# Patient Record
Sex: Male | Born: 1965 | Race: White | Hispanic: No | Marital: Single | Smoking: Never smoker
Health system: Southern US, Community
[De-identification: ages and names within clinical notes are randomized; demographics above are authoritative.]

## PROBLEM LIST (undated history)

## (undated) DIAGNOSIS — G5600 Carpal tunnel syndrome, unspecified upper limb: Secondary | ICD-10-CM

## (undated) DIAGNOSIS — F419 Anxiety disorder, unspecified: Secondary | ICD-10-CM

## (undated) HISTORY — PX: HERNIA REPAIR: SHX51

---

## 2015-07-25 ENCOUNTER — Emergency Department (HOSPITAL_COMMUNITY): Payer: Federal, State, Local not specified - PPO

## 2015-07-25 ENCOUNTER — Emergency Department (HOSPITAL_BASED_OUTPATIENT_CLINIC_OR_DEPARTMENT_OTHER)
Admit: 2015-07-25 | Discharge: 2015-07-25 | Disposition: A | Discharge status: Home / Self Care | Payer: Federal, State, Local not specified - PPO | Attending: Emergency Medicine | Admitting: Emergency Medicine

## 2015-07-25 ENCOUNTER — Encounter (HOSPITAL_COMMUNITY): Payer: Self-pay | Admitting: Emergency Medicine

## 2015-07-25 ENCOUNTER — Emergency Department (HOSPITAL_COMMUNITY)
Admission: EM | Admit: 2015-07-25 | Discharge: 2015-07-25 | Disposition: A | Discharge status: Home / Self Care | Payer: Federal, State, Local not specified - PPO | Attending: Emergency Medicine | Admitting: Emergency Medicine

## 2015-07-25 DIAGNOSIS — M79609 Pain in unspecified limb: Secondary | ICD-10-CM

## 2015-07-25 DIAGNOSIS — I10 Essential (primary) hypertension: Secondary | ICD-10-CM | POA: Insufficient documentation

## 2015-07-25 DIAGNOSIS — M7989 Other specified soft tissue disorders: Secondary | ICD-10-CM

## 2015-07-25 DIAGNOSIS — T148XXA Other injury of unspecified body region, initial encounter: Secondary | ICD-10-CM

## 2015-07-25 HISTORY — DX: Carpal tunnel syndrome, unspecified upper limb: G56.00

## 2015-07-25 LAB — CBC WITH DIFFERENTIAL/PLATELET
Basophils Absolute: 0.1 10*3/uL (ref 0.0–0.1)
Basophils Relative: 1 %
EOS PCT: 3 %
Eosinophils Absolute: 0.2 10*3/uL (ref 0.0–0.7)
HCT: 46.7 % (ref 39.0–52.0)
Hemoglobin: 15.3 g/dL (ref 13.0–17.0)
Lymphocytes Relative: 21 %
Lymphs Abs: 1.8 10*3/uL (ref 0.7–4.0)
MCH: 28.7 pg (ref 26.0–34.0)
MCHC: 32.8 g/dL (ref 30.0–36.0)
MCV: 87.6 fL (ref 78.0–100.0)
MONO ABS: 0.7 10*3/uL (ref 0.1–1.0)
MONOS PCT: 8 %
NEUTROS PCT: 67 %
Neutro Abs: 5.8 10*3/uL (ref 1.7–7.7)
PLATELETS: 165 10*3/uL (ref 150–400)
RBC: 5.33 MIL/uL (ref 4.22–5.81)
RDW: 14.6 % (ref 11.5–15.5)
WBC: 8.5 10*3/uL (ref 4.0–10.5)

## 2015-07-25 NOTE — Progress Notes (Signed)
VASCULAR LAB PRELIMINARY  PRELIMINARY  PRELIMINARY  PRELIMINARY  Left upper extremity venous duplex has been completed.    LEFT upper extremity:  No evidence of DVT or superficial thrombosis.    Gave results to KonawaJada ,RN  Jenetta Logesami Vernisha Bacote, RVT, RDMS 07/25/2015, 2:44 PM

## 2015-07-25 NOTE — ED Notes (Signed)
Patient transported to X-ray 

## 2015-07-25 NOTE — ED Provider Notes (Signed)
CSN: [id number]     Arrival date & time 07/25/15  1105 History   First MD Initiated Contact with Patient 07/25/15 1118     Chief Complaint  Patient presents with  . Arm Swelling  . Hypertension      HPI Patient reports swelling and bruising to his left upper extremity.  This been present for 6 days.  He awoke last Thursday with pain in his left elbow and a small amount of swelling.  He reports that the bruising and swelling of his left upper extremity is worsened.  He does have some pain with palpation of his left medial elbow.  He denies weakness of his left upper extremity.  He does not remember any injury or trauma to his left upper extremity.  No history DVT.  He was seen in urgent care today and sent to the emergency department for evaluation.  No other easy bruising or bleeding noted by the patient.  Patient currently is without a primary care physician and relocated from [place] 8-10 years ago.   Past Medical History  Diagnosis Date  . Carpal tunnel syndrome    History reviewed. No pertinent past surgical history. No family history on file. Social History  Substance Use Topics  . Smoking status: Never Smoker   . Smokeless tobacco: None  . Alcohol Use: No    Review of Systems  All other systems reviewed and are negative.     Allergies  Bee venom  Home Medications   Prior to Admission medications   Medication Sig Start Date End Date Taking? Authorizing Provider  Ascorbic Acid (VITAMIN C PO) Take 1 tablet by mouth daily.   Yes Historical Provider, MD  CALCIUM PO Take 1 tablet by mouth daily.   Yes Historical Provider, MD  Cholecalciferol (VITAMIN D3 PO) Take 1 capsule by mouth daily.   Yes Historical Provider, MD  Cyanocobalamin (B-12 PO) Take 1 tablet by mouth daily.   Yes Historical Provider, MD  diphenhydrAMINE (BENADRYL) 25 MG tablet Take 25 mg by mouth every 6 (six) hours as needed for itching or allergies.   Yes Historical Provider, MD  Ibuprofen-Diphenhydramine  Cit (ADVIL PM PO) Take 1 tablet by mouth daily as needed (sleep).   Yes Historical Provider, MD  MAGNESIUM PO Take 1 tablet by mouth daily.   Yes Historical Provider, MD  Multiple Vitamin (MULTIVITAMIN WITH MINERALS) TABS tablet Take 1 tablet by mouth daily.   Yes Historical Provider, MD   BP 151/99 mmHg  Pulse 84  Temp(Src) 98.4 F (36.9 C) (Oral)  Resp 18  Ht 6' (1.829 m)  Wt 321 lb 14 oz (146 kg)  BMI 43.64 kg/m2  SpO2 92% Physical Exam  Constitutional: He is oriented to person, place, and time. He appears well-developed and well-nourished.  Morbidly obese  HENT:  Head: Normocephalic and atraumatic.  Eyes: EOM are normal.  Neck: Normal range of motion.  Cardiovascular: Normal rate, regular rhythm and normal heart sounds.   Pulmonary/Chest: Effort normal and breath sounds normal. No respiratory distress.  Abdominal: Soft. He exhibits no distension. There is no tenderness.  Musculoskeletal: Normal range of motion.  Mild swelling of left upper extremity as compared to right.  Normal left radial pulse.  Diffuse bruising throughout his left medial upper and lower arm with some tenderness at the left medial upper condyle.  No obvious deformity.  Full range of motion of left elbow, left shoulder, left wrist.  Neurological: He is alert and oriented to person, place, and  time.  Skin: Skin is warm and dry.  Psychiatric: He has a normal mood and affect. Judgment normal.  Nursing note and vitals reviewed.   ED Course  Procedures (including critical care time) Labs Review Labs Reviewed  CBC WITH DIFFERENTIAL/PLATELET    Imaging Review Dg Elbow Complete Left  07/25/2015  CLINICAL DATA:  Bruising and pain left elbow for 6 days. No known trauma EXAM: LEFT ELBOW - COMPLETE 3+ VIEW COMPARISON:  None. FINDINGS: Frontal, lateral, and bilateral oblique views were obtained. There is generalized soft tissue swelling. There is no apparent fracture or dislocation. No joint effusion. The joint spaces  appear normal. No erosive change. There is an apparent bone island in the proximal radial metaphysis. IMPRESSION: Soft tissue swelling. No fracture or dislocation. No appreciable arthropathy. Bone island proximal radius. Electronically Signed   By: Bretta Bang III M.D.   On: 07/25/2015 13:21   I have personally reviewed and evaluated these images and lab results as part of my medical decision-making.   EKG Interpretation None      MDM   Final diagnoses:  Left upper extremity swelling  Bruising    These duplex of left upper extremity demonstrates no DVT.  Plain film of left elbow demonstrates no elbow fracture.  Likely associated bruising and contusion.  Platelets are normal.  Hemoglobin is normal.  Vital signs are normal.  Whenever he does drift off to sleep.  Emergency department he does drop his oxygen saturations to 70% and they quickly rise when he is awoken.  I suspect the patient has significant sleep apnea.  The patient does report insomnia.  He does not have a primary care physician but I recommended weight loss and exercise as well as a primary care physician as he will need a sleep study.  The patient was informed of this.  He will also need his blood pressure evaluated in a primary care setting.    Azalia Bilis, MD 07/25/15 [phone]

## 2015-07-25 NOTE — ED Notes (Signed)
MD at bedside. 

## 2015-07-25 NOTE — Discharge Instructions (Signed)
Sleep Apnea  Sleep apnea is a sleep disorder characterized by abnormal pauses in breathing while you sleep. When your breathing pauses, the level of oxygen in your blood decreases. This causes you to move out of deep sleep and into light sleep. As a result, your quality of sleep is poor, and the system that carries your blood throughout your body (cardiovascular system) experiences stress. If sleep apnea remains untreated, the following conditions can develop:  High blood pressure (hypertension).  Coronary artery disease.  Inability to achieve or maintain an erection (impotence).  Impairment of your thought process (cognitive dysfunction). There are three types of sleep apnea: 1. Obstructive sleep apnea--Pauses in breathing during sleep because of a blocked airway. 2. Central sleep apnea--Pauses in breathing during sleep because the area of the brain that controls your breathing does not send the correct signals to the muscles that control breathing. 3. Mixed sleep apnea--A combination of both obstructive and central sleep apnea. RISK FACTORS The following risk factors can increase your risk of developing sleep apnea:  Being overweight.  Smoking.  Having narrow passages in your nose and throat.  Being of older age.  Being male.  Alcohol use.  Sedative and tranquilizer use.  Ethnicity. Among individuals younger than 35 years, African Americans are at increased risk of sleep apnea. SYMPTOMS   Difficulty staying asleep.  Daytime sleepiness and fatigue.  Loss of energy.  Irritability.  Loud, heavy snoring.  Morning headaches.  Trouble concentrating.  Forgetfulness.  Decreased interest in sex.  Unexplained sleepiness. DIAGNOSIS  In order to diagnose sleep apnea, your caregiver will perform a physical examination. A sleep study done in the comfort of your own home may be appropriate if you are otherwise healthy. Your caregiver may also recommend that you spend the  night in a sleep lab. In the sleep lab, several monitors record information about your heart, lungs, and brain while you sleep. Your leg and arm movements and blood oxygen level are also recorded. TREATMENT The following actions may help to resolve mild sleep apnea:  Sleeping on your side.   Using a decongestant if you have nasal congestion.   Avoiding the use of depressants, including alcohol, sedatives, and narcotics.   Losing weight and modifying your diet if you are overweight. There also are devices and treatments to help open your airway:  Oral appliances. These are custom-made mouthpieces that shift your lower jaw forward and slightly open your bite. This opens your airway.  Devices that create positive airway pressure. This positive pressure "splints" your airway open to help you breathe better during sleep. The following devices create positive airway pressure:  Continuous positive airway pressure (CPAP) device. The CPAP device creates a continuous level of air pressure with an air pump. The air is delivered to your airway through a mask while you sleep. This continuous pressure keeps your airway open.  Nasal expiratory positive airway pressure (EPAP) device. The EPAP device creates positive air pressure as you exhale. The device consists of single-use valves, which are inserted into each nostril and held in place by adhesive. The valves create very little resistance when you inhale but create much more resistance when you exhale. That increased resistance creates the positive airway pressure. This positive pressure while you exhale keeps your airway open, making it easier to breath when you inhale again.  Bilevel positive airway pressure (BPAP) device. The BPAP device is used mainly in patients with central sleep apnea. This device is similar to the CPAP device because   it also uses an air pump to deliver continuous air pressure through a mask. However, with the BPAP machine, the  pressure is set at two different levels. The pressure when you exhale is lower than the pressure when you inhale.  Surgery. Typically, surgery is only done if you cannot comply with less invasive treatments or if the less invasive treatments do not improve your condition. Surgery involves removing excess tissue in your airway to create a wider passage way.   This information is not intended to replace advice given to you by your health care provider. Make sure you discuss any questions you have with your health care provider.   Document Released: 01/24/2002 Document Revised: 02/24/2014 Document Reviewed: 06/12/2011 Elsevier Interactive Patient Education 2016 Elsevier Inc.   

## 2015-07-25 NOTE — ED Notes (Signed)
Vascular ultra sound at bedside.

## 2015-07-25 NOTE — ED Notes (Addendum)
Per EMS, [facility] swelling/bruising x6 days; worsening as the days progress. Denies injury/trauma/insect bite. Left radial pulse present. Urgent Care also wanted patient evaluated for elevated blood pressure at 168/118. No hx of HTN.

## 2015-07-25 NOTE — ED Notes (Signed)
MD aware of patients vital signs.  

## 2015-07-25 NOTE — ED Notes (Signed)
Discharge instructions and follow up care reviewed with patient. Patient verbalized understanding. 

## 2017-12-09 ENCOUNTER — Ambulatory Visit (INDEPENDENT_AMBULATORY_CARE_PROVIDER_SITE_OTHER): Payer: Self-pay

## 2017-12-09 ENCOUNTER — Ambulatory Visit (INDEPENDENT_AMBULATORY_CARE_PROVIDER_SITE_OTHER): Payer: Federal, State, Local not specified - PPO | Admitting: Orthopaedic Surgery

## 2017-12-09 ENCOUNTER — Encounter (INDEPENDENT_AMBULATORY_CARE_PROVIDER_SITE_OTHER): Payer: Self-pay | Admitting: Orthopaedic Surgery

## 2017-12-09 DIAGNOSIS — G8929 Other chronic pain: Secondary | ICD-10-CM | POA: Diagnosis not present

## 2017-12-09 DIAGNOSIS — M25512 Pain in left shoulder: Secondary | ICD-10-CM

## 2017-12-09 MED ORDER — LIDOCAINE HCL 1 % IJ SOLN
3.0000 mL | INTRAMUSCULAR | Status: AC | PRN
  Administered 2017-12-09: 3 mL

## 2017-12-09 MED ORDER — BUPIVACAINE HCL 0.5 % IJ SOLN
3.0000 mL | INTRAMUSCULAR | Status: AC | PRN
  Administered 2017-12-09: 3 mL via INTRA_ARTICULAR

## 2017-12-09 MED ORDER — METHYLPREDNISOLONE ACETATE 40 MG/ML IJ SUSP
40.0000 mg | INTRAMUSCULAR | Status: AC | PRN
  Administered 2017-12-09: 40 mg via INTRA_ARTICULAR

## 2017-12-09 NOTE — Progress Notes (Signed)
Office Visit Note   Patient: Allen Perry           Date of Birth: [birth year]           MRN: [id number] Visit Date: 12/09/2017              Requested by: No referring provider defined for this encounter. PCP: Patient, No Pcp Per   Assessment & Plan: Visit Diagnoses:  1. Chronic left shoulder pain     Plan: Impression is left rotator cuff syndrome versus subacromial bursitis.  We performed a subacromial injection today and made a referral to physical therapy.  I would like to recheck him in 4 weeks.  If he is not better then I would recommend an MRI at that time.  Work note was provided for no more than 20 pounds lifting.  Follow-Up Instructions: Return in about 4 weeks (around 01/06/2018).   Orders:  Orders Placed This Encounter  Procedures  . XR Shoulder Left   No orders of the defined types were placed in this encounter.     Procedures: Large Joint Inj: L subacromial bursa on 12/09/2017 4:53 PM Indications: pain Details: 22 G needle  Arthrogram: No  Medications: 3 mL lidocaine 1 %; 3 mL bupivacaine 0.5 %; 40 mg methylPREDNISolone acetate 40 MG/ML Outcome: tolerated well, no immediate complications Patient was prepped and draped in the usual sterile fashion.       Clinical Data: No additional findings.   Subjective: Chief Complaint  Patient presents with  . Left Arm Pain    Jerrel is a 52 year old right-hand-dominant gentleman who comes in with left shoulder pain for about 4 to 6 weeks.  He states the pain is worse when lifting his arm and he states the pain is a burning aching pain without radiation.  The pain is mainly localized to the lateral deltoid region.  He denies any radicular symptoms.  Denies any numbness and tingling.   Review of Systems  Constitutional: Negative.   All other systems reviewed and are negative.    Objective: Vital Signs: There were no vitals taken for this visit.  Physical Exam  Constitutional: He is oriented to person,  place, and time. He appears well-developed and well-nourished.  HENT:  Head: Normocephalic and atraumatic.  Eyes: Pupils are equal, round, and reactive to light.  Neck: Neck supple.  Pulmonary/Chest: Effort normal.  Abdominal: Soft.  Musculoskeletal: Normal range of motion.  Neurological: He is alert and oriented to person, place, and time.  Skin: Skin is warm.  Psychiatric: He has a normal mood and affect. His behavior is normal. Judgment and thought content normal.  Nursing note and vitals reviewed.   Ortho Exam Left shoulder exam shows positive empty can and positive impingement.  Negative cross adduction.  Normal range of motion with pain at the extremes. Specialty Comments:  No specialty comments available.  Imaging: Xr Shoulder Left  Result Date: 12/09/2017 No acute or structural abnormalities    PMFS History: There are no active problems to display for this patient.  Past Medical History:  Diagnosis Date  . Carpal tunnel syndrome     No family history on file.  No past surgical history on file. Social History   Occupational History  . Not on file  Tobacco Use  . Smoking status: Never Smoker  . Smokeless tobacco: Never Used  Substance and Sexual Activity  . Alcohol use: No  . Drug use: Not on file  . Sexual activity: Not on file

## 2018-01-05 ENCOUNTER — Encounter (INDEPENDENT_AMBULATORY_CARE_PROVIDER_SITE_OTHER): Payer: Self-pay | Admitting: Orthopaedic Surgery

## 2018-01-05 ENCOUNTER — Ambulatory Visit (INDEPENDENT_AMBULATORY_CARE_PROVIDER_SITE_OTHER): Payer: Federal, State, Local not specified - PPO | Admitting: Orthopaedic Surgery

## 2018-01-05 DIAGNOSIS — G8929 Other chronic pain: Secondary | ICD-10-CM

## 2018-01-05 DIAGNOSIS — M25512 Pain in left shoulder: Secondary | ICD-10-CM | POA: Diagnosis not present

## 2018-01-05 NOTE — Progress Notes (Signed)
   Office Visit Note   Patient: Allen SewerGary Perry           Date of Birth: 16-Apr-1965           MRN: [id number] Visit Date: 01/05/2018              Requested by: No referring provider defined for this encounter. PCP: Patient, No Pcp Per   Assessment & Plan: Visit Diagnoses:  1. Chronic left shoulder pain     Plan: Impression is continued left shoulder pain.  I did recommend an MRI at this point since he has had partial relief from conservative treatment.  He is in the process of getting this claimed as a work comp injury.  Work note was provided again for less than 20 pounds of lifting for another 6 weeks.  He will let us know when he wants to proceed with an MRI.  Follow-Up Instructions: Return if symptoms worsen or fail to improve.   Orders:  No orders of the defined types were placed in this encounter.  No orders of the defined types were placed in this encounter.     Procedures: No procedures performed   Clinical Data: No additional findings.   Subjective: Chief Complaint  Patient presents with  . Left Shoulder - Pain    [name] follows up today for continued left shoulder pain.  He received an injection on 12/09/2017.  He states that the injection has helped a lot.  He has very little pain.  Overall he is doing better.  He states that he takes Advil as needed.  He still has pain when he is elevating his shoulder.   Review of Systems  Constitutional: Negative.   All other systems reviewed and are negative.    Objective: Vital Signs: There were no vitals taken for this visit.  Physical Exam  Constitutional: He is oriented to person, place, and time. He appears well-developed and well-nourished.  Pulmonary/Chest: Effort normal.  Abdominal: Soft.  Neurological: He is alert and oriented to person, place, and time.  Skin: Skin is warm.  Psychiatric: He has a normal mood and affect. His behavior is normal. Judgment and thought content normal.  Nursing note and vitals  reviewed.   Ortho Exam Left shoulder exam shows positive impingement and positive cross adduction.  Rotator cuff testing elicits pain with empty can testing with mild weakness. Specialty Comments:  No specialty comments available.  Imaging: No results found.   PMFS History: There are no active problems to display for this patient.  Past Medical History:  Diagnosis Date  . Carpal tunnel syndrome     History reviewed. No pertinent family history.  History reviewed. No pertinent surgical history. Social History   Occupational History  . Not on file  Tobacco Use  . Smoking status: Never Smoker  . Smokeless tobacco: Never Used  Substance and Sexual Activity  . Alcohol use: No  . Drug use: Not on file  . Sexual activity: Not on file

## 2018-02-26 ENCOUNTER — Encounter (INDEPENDENT_AMBULATORY_CARE_PROVIDER_SITE_OTHER): Payer: Self-pay | Admitting: Orthopaedic Surgery

## 2018-02-26 ENCOUNTER — Ambulatory Visit (INDEPENDENT_AMBULATORY_CARE_PROVIDER_SITE_OTHER): Payer: Federal, State, Local not specified - PPO | Admitting: Orthopaedic Surgery

## 2018-02-26 DIAGNOSIS — M25512 Pain in left shoulder: Secondary | ICD-10-CM

## 2018-02-26 DIAGNOSIS — G8929 Other chronic pain: Secondary | ICD-10-CM | POA: Diagnosis not present

## 2018-02-26 NOTE — Progress Notes (Signed)
   Office Visit Note   Patient: Allen Perry           Date of Birth: 1965/06/08           MRN: [id number] Visit Date: 02/26/2018              Requested by: No referring provider defined for this encounter. PCP: Patient, No Pcp Per   Assessment & Plan: Visit Diagnoses:  1. Chronic left shoulder pain     Plan: Impression is chronic left shoulder pain with minimal relief from conservative treatments.  At this point we will continue his work restrictions of less than 20 pounds of lifting.  We will continue to await approval from Worker's Compensation on the MRI.  We will see him back after the MRI.  Follow-Up Instructions: Return if symptoms worsen or fail to improve.   Orders:  No orders of the defined types were placed in this encounter.  No orders of the defined types were placed in this encounter.     Procedures: No procedures performed   Clinical Data: No additional findings.   Subjective: Chief Complaint  Patient presents with  . Left Shoulder - Pain, Follow-up    Allen Perry follows up today for his continued left shoulder pain.  He is still working on [employer]. approval for the MRI that we ordered.  Otherwise he is pretty much about the same.  Physical therapy and subacromial injections have helped only mildly.  He continues to have problems with shoulder function especially with lifting overhead.   Review of Systems   Objective: Vital Signs: There were no vitals taken for this visit.  Physical Exam  Ortho Exam Left shoulder exam is stable. Specialty Comments:  No specialty comments available.  Imaging: No results found.   PMFS History: There are no active problems to display for this patient.  Past Medical History:  Diagnosis Date  . Carpal tunnel syndrome     History reviewed. No pertinent family history.  History reviewed. No pertinent surgical history. Social History   Occupational History  . Not on file  Tobacco Use  . Smoking status:  Never Smoker  . Smokeless tobacco: Never Used  Substance and Sexual Activity  . Alcohol use: No  . Drug use: Not on file  . Sexual activity: Not on file

## 2018-05-05 ENCOUNTER — Telehealth (INDEPENDENT_AMBULATORY_CARE_PROVIDER_SITE_OTHER): Payer: Self-pay | Admitting: Orthopaedic Surgery

## 2018-05-05 ENCOUNTER — Other Ambulatory Visit (INDEPENDENT_AMBULATORY_CARE_PROVIDER_SITE_OTHER): Payer: Self-pay

## 2018-05-05 DIAGNOSIS — M25512 Pain in left shoulder: Principal | ICD-10-CM

## 2018-05-05 DIAGNOSIS — G8929 Other chronic pain: Secondary | ICD-10-CM

## 2018-05-05 NOTE — Telephone Encounter (Signed)
Patient came into the clinic stating that he is wanting to proceed with the MRI.  He also stated that his WC has been denied.  CB#[phone].  You can leave a message if he does not answer.  Thank you.

## 2018-05-05 NOTE — Telephone Encounter (Signed)
Yes.  R/o structural abnormalities

## 2018-05-05 NOTE — Telephone Encounter (Signed)
See message below °

## 2018-05-05 NOTE — Telephone Encounter (Signed)
AMY see first message below. Just an FYI. Thank you.

## 2018-05-05 NOTE — Telephone Encounter (Signed)
MRI Order made.  

## 2018-07-09 ENCOUNTER — Other Ambulatory Visit: Payer: Federal, State, Local not specified - PPO

## 2018-07-20 ENCOUNTER — Ambulatory Visit
Admission: RE | Admit: 2018-07-20 | Discharge: 2018-07-20 | Disposition: A | Discharge status: Home / Self Care | Payer: Federal, State, Local not specified - PPO | Source: Ambulatory Visit | Attending: Orthopaedic Surgery | Admitting: Orthopaedic Surgery

## 2018-07-20 ENCOUNTER — Other Ambulatory Visit: Payer: Self-pay

## 2018-07-20 DIAGNOSIS — G8929 Other chronic pain: Secondary | ICD-10-CM

## 2018-07-20 DIAGNOSIS — M25512 Pain in left shoulder: Secondary | ICD-10-CM

## 2018-07-20 NOTE — Progress Notes (Signed)
Needs f/u appt 

## 2018-07-23 ENCOUNTER — Telehealth: Payer: Self-pay

## 2018-07-23 NOTE — Telephone Encounter (Signed)
Called patient no answer LMOM to return call to make MRI APPT with Dr Roda Shutters.

## 2018-08-03 ENCOUNTER — Encounter: Payer: Self-pay | Admitting: Orthopaedic Surgery

## 2018-08-03 ENCOUNTER — Other Ambulatory Visit: Payer: Self-pay

## 2018-08-03 ENCOUNTER — Ambulatory Visit (INDEPENDENT_AMBULATORY_CARE_PROVIDER_SITE_OTHER): Payer: Federal, State, Local not specified - PPO | Admitting: Orthopaedic Surgery

## 2018-08-03 DIAGNOSIS — M75112 Incomplete rotator cuff tear or rupture of left shoulder, not specified as traumatic: Secondary | ICD-10-CM

## 2018-08-03 DIAGNOSIS — M7542 Impingement syndrome of left shoulder: Secondary | ICD-10-CM | POA: Diagnosis not present

## 2018-08-03 NOTE — Progress Notes (Signed)
   Office Visit Note   Patient: Allen Perry           Date of Birth: 1965/07/04           MRN: [id number] Visit Date: 08/03/2018              Requested by: No referring provider defined for this encounter. PCP: Patient, No Pcp Per   Assessment & Plan: Visit Diagnoses:  1. Nontraumatic incomplete tear of left rotator cuff   2. Impingement syndrome of left shoulder     Plan: Based on MRI findings he does have an undersurface tear of the distal supraspinatus tendon of about 0.5 cm.  Is moderate AC joint arthrosis.  Based on these findings and lack of improvement from conservative treatment over the last 8 to 9 months patient elects to proceed with surgical debridement and evaluation of the rotator cuff and repair as indicated.  Risks and benefits and rehab and recovery were discussed with the patient.  He would like to have this scheduled in the near future.  Follow-Up Instructions: Return for 1 week postop visit.   Orders:  No orders of the defined types were placed in this encounter.  No orders of the defined types were placed in this encounter.     Procedures: No procedures performed   Clinical Data: No additional findings.   Subjective: Chief Complaint  Patient presents with  . Left Shoulder - Follow-up    Allen Perry is a 53 year old who comes back today for MRI review of his left shoulder.  Overall he has had problems with his left shoulder for the last 8 to 9 months.  He has gotten temporary relief from injections and he has done extensive physical therapy but his improvement has plateaued.  This was previously a workers comp claim but was denied.   Review of Systems   Objective: Vital Signs: There were no vitals taken for this visit.  Physical Exam  Ortho Exam Left shoulder exam is unchanged. Specialty Comments:  No specialty comments available.  Imaging: No results found.   PMFS History: There are no active problems to display for this patient.  Past  Medical History:  Diagnosis Date  . Carpal tunnel syndrome     History reviewed. No pertinent family history.  History reviewed. No pertinent surgical history. Social History   Occupational History  . Not on file  Tobacco Use  . Smoking status: Never Smoker  . Smokeless tobacco: Never Used  Substance and Sexual Activity  . Alcohol use: No  . Drug use: Not on file  . Sexual activity: Not on file

## 2018-08-10 ENCOUNTER — Encounter (HOSPITAL_BASED_OUTPATIENT_CLINIC_OR_DEPARTMENT_OTHER): Payer: Self-pay | Admitting: *Deleted

## 2018-08-10 ENCOUNTER — Other Ambulatory Visit: Payer: Self-pay

## 2018-08-11 ENCOUNTER — Encounter (HOSPITAL_BASED_OUTPATIENT_CLINIC_OR_DEPARTMENT_OTHER)
Admission: RE | Admit: 2018-08-11 | Discharge: 2018-08-11 | Disposition: A | Discharge status: Home / Self Care | Payer: Federal, State, Local not specified - PPO | Source: Ambulatory Visit | Attending: Orthopaedic Surgery | Admitting: Orthopaedic Surgery

## 2018-08-11 DIAGNOSIS — Z01812 Encounter for preprocedural laboratory examination: Secondary | ICD-10-CM | POA: Insufficient documentation

## 2018-08-11 LAB — BASIC METABOLIC PANEL
Anion gap: 9 (ref 5–15)
BUN: 16 mg/dL (ref 6–20)
CO2: 29 mmol/L (ref 22–32)
Calcium: 9.4 mg/dL (ref 8.9–10.3)
Chloride: 100 mmol/L (ref 98–111)
Creatinine, Ser: 0.97 mg/dL (ref 0.61–1.24)
GFR calc Af Amer: >60 mL/min (ref >60)
GFR calc non Af Amer: >60 mL/min (ref >60)
Glucose, Bld: 290 mg/dL — ABNORMAL HIGH (ref 70–99)
Potassium: 4.6 mmol/L (ref 3.5–5.1)
Sodium: 138 mmol/L (ref 135–145)

## 2018-08-11 NOTE — Progress Notes (Addendum)
Gatorade 2 drink given with instructions to complete by [facility], pt verbalized understanding.  Glucose-290, Dr. Fransisco Beau (anethesia) notified, Sherrie at Dr. Phoebe Sharps office notified.

## 2018-08-12 ENCOUNTER — Encounter: Payer: Self-pay | Admitting: Family Medicine

## 2018-08-12 ENCOUNTER — Ambulatory Visit (INDEPENDENT_AMBULATORY_CARE_PROVIDER_SITE_OTHER): Payer: Federal, State, Local not specified - PPO | Admitting: Family Medicine

## 2018-08-12 ENCOUNTER — Other Ambulatory Visit: Payer: Self-pay

## 2018-08-12 VITALS — BP 165/109 | HR 93

## 2018-08-12 DIAGNOSIS — R739 Hyperglycemia, unspecified: Secondary | ICD-10-CM

## 2018-08-12 DIAGNOSIS — E6609 Other obesity due to excess calories: Secondary | ICD-10-CM | POA: Diagnosis not present

## 2018-08-12 DIAGNOSIS — Z01818 Encounter for other preprocedural examination: Secondary | ICD-10-CM

## 2018-08-12 DIAGNOSIS — Z6839 Body mass index (BMI) 39.0-39.9, adult: Secondary | ICD-10-CM

## 2018-08-12 DIAGNOSIS — R030 Elevated blood-pressure reading, without diagnosis of hypertension: Secondary | ICD-10-CM

## 2018-08-12 DIAGNOSIS — E66812 Obesity, class 2: Secondary | ICD-10-CM | POA: Insufficient documentation

## 2018-08-12 DIAGNOSIS — M75112 Incomplete rotator cuff tear or rupture of left shoulder, not specified as traumatic: Secondary | ICD-10-CM

## 2018-08-12 NOTE — Progress Notes (Signed)
Office Visit Note   Patient: Allen Perry           Date of Birth: 01-04-1966           MRN: [id number] Visit Date: 08/12/2018 Requested by: No referring provider defined for this encounter. PCP: Patient, No Pcp Per  Subjective: Chief Complaint  Patient presents with  . High CBG on preop labwork yesterday    HPI: He is a 53 year old here for a preoperative clearance.  He is scheduled for left shoulder scope and debridement next week per Dr. Roda ShuttersXu.  He had preoperative basic metabolic panel showing a glucose of 296.  Patient was not fasting for these labs.  He does note that more than 20 years ago he had symptoms of diabetes and had some testing done which was negative.  The last time he had labs drawn was about 20 years ago per his report.  He does not go to the doctor on a regular basis.  He does go to an eye doctor every 6 months to have his glasses regulated.  He has not been told that his funduscopic exam was abnormal.  He does report that he gets numbness in his hands and feet consistently.  He denies any polyuria, polydipsia.  He eats very poorly, drinks about 4 or 5 full sugar soft drinks every day and eats mainly from the vending machines at work.  He does report that he is hoping to start making some lifestyle changes while he is recovering from his surgery.  He plans to start weaning off soft drinks and drinking unsweetened tea instead.  He is never had troubles with his blood pressure.  He denies any headaches, chest pain, shortness of breath, palpitations.  He has a family history of diabetes in both parents.  His father died in his late 6080s from mini strokes.               ROS: Denies fevers or chills.  All other systems were reviewed and are negative.  Objective: Vital Signs: BP (!) 165/109 (BP Location: Right Arm, Patient Position: Sitting, Cuff Size: Large)   Pulse 93   Physical Exam:  General:  Alert and oriented, in no acute distress. Pulm:  Breathing  unlabored. Psy:  Normal mood, congruent affect. Skin: No visible rash. HEENT:  [place]/AT, PERRLA, EOM Full, no nystagmus.  Funduscopic examination within normal limits.  No conjunctival erythema.  Tympanic membranes are pearly gray with normal landmarks.  External ear canals are normal.  Nasal passages are clear.  Oropharynx is clear.  No significant lymphadenopathy.  No thyromegaly or nodules.  2+ carotid pulses without bruits. CV: Regular rate and rhythm without murmurs, rubs, or gallops.  No peripheral edema.  2+ radial and posterior tibial pulses. Lungs: Clear to auscultation throughout with no wheezing or areas of consolidation. Feet: Significantly diminished sharp/dull sensation in both great toes.  No skin lesions on his feet.   Imaging: None today.  Assessment & Plan: 1.  Preoperative examination -His Chales AbrahamsGupta Perioperative Cardiac Risk score is 0.12%.  He is at low risk for cardiovascular complications during his surgery.  He is cleared to proceed as scheduled.  2.  Hyperglycemia with probable diabetes - We will draw some additional labs including A1c today.  Patient is willing to start making some lifestyle changes to try to manage this without medication.  We will recheck labs in about 3 to 4 months and if not making progress, then consider medications.  3.  Elevated blood pressure without diagnosis of hypertension - I think this will improve with lifestyle changes mentioned above.  We will monitor this closely and start an ACE inhibitor if pressure does not improve.  4.  Obesity - Lifestyle changes as above.     Procedures: No procedures performed  No notes on file     PMFS History: Patient Active Problem List   Diagnosis Date Noted  . Class 2 obesity due to excess calories without serious comorbidity with body mass index (BMI) of 39.0 to 39.9 in adult 08/12/2018   Past Medical History:  Diagnosis Date  . Carpal tunnel syndrome     History reviewed. No pertinent family  history.  Past Surgical History:  Procedure Laterality Date  . HERNIA REPAIR     Social History   Occupational History  . Not on file  Tobacco Use  . Smoking status: Never Smoker  . Smokeless tobacco: Never Used  Substance and Sexual Activity  . Alcohol use: No  . Drug use: Not on file  . Sexual activity: Not on file

## 2018-08-13 ENCOUNTER — Telehealth: Payer: Self-pay | Admitting: Family Medicine

## 2018-08-13 ENCOUNTER — Encounter: Payer: Self-pay | Admitting: Family Medicine

## 2018-08-13 DIAGNOSIS — E119 Type 2 diabetes mellitus without complications: Secondary | ICD-10-CM | POA: Insufficient documentation

## 2018-08-13 LAB — CBC WITH DIFFERENTIAL/PLATELET
Absolute Monocytes: 544 cells/uL (ref 200–950)
Basophils Absolute: 68 cells/uL (ref 0–200)
Basophils Relative: 0.8 %
Eosinophils Absolute: 162 cells/uL (ref 15–500)
Eosinophils Relative: 1.9 %
HCT: 49.1 % (ref 38.5–50.0)
Hemoglobin: 16 g/dL (ref 13.2–17.1)
Lymphs Abs: 2015 cells/uL (ref 850–3900)
MCH: 28.6 pg (ref 27.0–33.0)
MCHC: 32.6 g/dL (ref 32.0–36.0)
MCV: 87.7 fL (ref 80.0–100.0)
MPV: 12.1 fL (ref 7.5–12.5)
Monocytes Relative: 6.4 %
Neutro Abs: 5712 cells/uL (ref 1500–7800)
Neutrophils Relative %: 67.2 %
Platelets: 183 10*3/uL (ref 140–400)
RBC: 5.6 10*6/uL (ref 4.20–5.80)
RDW: 13.2 % (ref 11.0–15.0)
Total Lymphocyte: 23.7 %
WBC: 8.5 10*3/uL (ref 3.8–10.8)

## 2018-08-13 LAB — LIPID PANEL
Cholesterol: 191 mg/dL (ref <200)
HDL: 36 mg/dL — ABNORMAL LOW (ref >=40)
LDL Cholesterol (Calc): 114 mg/dL (calc) — ABNORMAL HIGH
Non-HDL Cholesterol (Calc): 155 mg/dL (calc) — ABNORMAL HIGH (ref <130)
Total CHOL/HDL Ratio: 5.3 (calc) — ABNORMAL HIGH (ref <5.0)
Triglycerides: 310 mg/dL — ABNORMAL HIGH (ref <150)

## 2018-08-13 LAB — THYROID PANEL WITH TSH
Free Thyroxine Index: 1.8 (ref 1.4–3.8)
T3 Uptake: 26 % (ref 22–35)
T4, Total: 7.1 ug/dL (ref 4.9–10.5)
TSH: 2.89 mIU/L (ref 0.40–4.50)

## 2018-08-13 LAB — HEMOGLOBIN A1C
Hgb A1c MFr Bld: 9.4 % of total Hgb — ABNORMAL HIGH (ref <5.7)
Mean Plasma Glucose: 223 (calc)
eAG (mmol/L): 12.4 (calc)

## 2018-08-13 NOTE — Telephone Encounter (Signed)
Left message on mobile voice mail to call back regarding lab results.

## 2018-08-13 NOTE — Telephone Encounter (Signed)
Labs show the following:  Poorly controlled diabetes is confirmed with hemoglobin A1c of 9.4.  We want this to be at least below 7, ideally below 6.  It is critically important to start making lifestyle changes, minimizing dietary intake of breads, pastas, cereals, sugars and sweets including soft drinks.  We should recheck this in about 3 months and if not significantly improved, then start medication.  Lipid numbers are abnormal with triglycerides of 310 and LDL slightly elevated at 114.  These should improve with lifestyle changes mentioned above.  Thyroid labs and blood count look good.

## 2018-08-13 NOTE — Telephone Encounter (Signed)
I spoke with the patient, giving him his lab results. He would like some time of diet mailed to him, if possible. He would like to see if it is something he could follow and think about whether or not he would like to start medication.

## 2018-08-14 ENCOUNTER — Other Ambulatory Visit (HOSPITAL_COMMUNITY): Payer: Federal, State, Local not specified - PPO

## 2018-08-16 NOTE — Telephone Encounter (Signed)
I mailed 3 pages on diabetes meal planning from the [facility] website. The link to more information, including a 5-week meal plan, can be found at that ling -- I highlighted and made note of this for the patient. I also left a full message on the patient's mobile voice mail explaining this. Mailing copy of the lab results to the patient as well.

## 2018-08-18 ENCOUNTER — Ambulatory Visit (HOSPITAL_BASED_OUTPATIENT_CLINIC_OR_DEPARTMENT_OTHER): Admit: 2018-08-18 | Payer: Federal, State, Local not specified - PPO | Admitting: Orthopaedic Surgery

## 2018-08-18 SURGERY — SHOULDER ARTHROSCOPY WITH ROTATOR CUFF REPAIR AND SUBACROMIAL DECOMPRESSION
Anesthesia: General | Site: Shoulder | Laterality: Left

## 2018-08-25 ENCOUNTER — Inpatient Hospital Stay: Payer: Federal, State, Local not specified - PPO | Admitting: Orthopaedic Surgery

## 2018-09-01 ENCOUNTER — Ambulatory Visit: Payer: Federal, State, Local not specified - PPO | Admitting: Orthopaedic Surgery

## 2018-09-02 ENCOUNTER — Encounter: Payer: Self-pay | Admitting: Orthopaedic Surgery

## 2018-09-02 ENCOUNTER — Ambulatory Visit (INDEPENDENT_AMBULATORY_CARE_PROVIDER_SITE_OTHER): Payer: Federal, State, Local not specified - PPO | Admitting: Orthopaedic Surgery

## 2018-09-02 ENCOUNTER — Other Ambulatory Visit: Payer: Self-pay

## 2018-09-02 DIAGNOSIS — M75112 Incomplete rotator cuff tear or rupture of left shoulder, not specified as traumatic: Secondary | ICD-10-CM

## 2018-09-02 NOTE — Progress Notes (Signed)
      Patient: Allen Perry           Date of Birth: 14-Jun-1965           MRN: [id number] Visit Date: 09/02/2018 PCP: Patient, No Pcp Per   Assessment & Plan:  Chief Complaint:  Chief Complaint  Patient presents with  . Left Shoulder - Follow-up   Visit Diagnoses:  1. Incomplete rotator cuff tear or rupture of left shoulder, not specified as traumatic     Plan: Patient is a pleasant 53 year old gentleman who presents our clinic today for follow-up of his right shoulder.  History of right shoulder rotator cuff tear.  He was scheduled to undergo surgical repair a few weeks back, but this was canceled due to recent diagnosis of diabetes and a hemoglobin A1c of 9.4 on 08/12/2018.  He is now on a keto friendly diet and is scheduled to see Dr. Junius Roads for follow-up in mid September where an hemoglobin A1c will be redrawn.  He is to call us to reschedule surgery should his A1c be below 8.0.  Otherwise, follow-up with [place] as needed.  Follow-Up Instructions: Return if symptoms worsen or fail to improve.   Orders:  No orders of the defined types were placed in this encounter.  No orders of the defined types were placed in this encounter.   Imaging: No new imaging  PMFS History: Patient Active Problem List   Diagnosis Date Noted  . Diabetes ([place]) 08/13/2018  . Class 2 obesity due to excess calories without serious comorbidity with body mass index (BMI) of 39.0 to 39.9 in adult 08/12/2018   Past Medical History:  Diagnosis Date  . Carpal tunnel syndrome     History reviewed. No pertinent family history.  Past Surgical History:  Procedure Laterality Date  . HERNIA REPAIR     Social History   Occupational History  . Not on file  Tobacco Use  . Smoking status: Never Smoker  . Smokeless tobacco: Never Used  Substance and Sexual Activity  . Alcohol use: No  . Drug use: Not on file  . Sexual activity: Not on file

## 2018-11-12 ENCOUNTER — Ambulatory Visit: Payer: Federal, State, Local not specified - PPO | Admitting: Family Medicine

## 2018-11-15 ENCOUNTER — Ambulatory Visit (INDEPENDENT_AMBULATORY_CARE_PROVIDER_SITE_OTHER): Payer: Federal, State, Local not specified - PPO | Admitting: Family Medicine

## 2018-11-15 ENCOUNTER — Encounter: Payer: Self-pay | Admitting: Family Medicine

## 2018-11-15 ENCOUNTER — Other Ambulatory Visit: Payer: Self-pay

## 2018-11-15 VITALS — BP 130/91 | HR 86 | Ht 72.0 in | Wt 242.2 lb

## 2018-11-15 DIAGNOSIS — E119 Type 2 diabetes mellitus without complications: Secondary | ICD-10-CM

## 2018-11-15 DIAGNOSIS — R030 Elevated blood-pressure reading, without diagnosis of hypertension: Secondary | ICD-10-CM | POA: Diagnosis not present

## 2018-11-15 DIAGNOSIS — E6609 Other obesity due to excess calories: Secondary | ICD-10-CM

## 2018-11-15 DIAGNOSIS — E781 Pure hyperglyceridemia: Secondary | ICD-10-CM | POA: Diagnosis not present

## 2018-11-15 DIAGNOSIS — Z6839 Body mass index (BMI) 39.0-39.9, adult: Secondary | ICD-10-CM

## 2018-11-15 NOTE — Progress Notes (Signed)
   Office Visit Note   Patient: Allen Perry           Date of Birth: [birth year]           MRN: [id number] Visit Date: 11/15/2018 Requested by: No referring provider defined for this encounter. PCP: Patient, No Pcp Per  Subjective: Chief Complaint  Patient presents with  . 3 mos. f/u blood sugar & blood pressure    HPI: He is here for follow-up diabetes, elevated blood pressure, and obesity.  It has been 3 months since his last visit.  He has gone on a modified ketogenic diet and has lost about 50 pounds.  He has not been checking his blood sugar, but his blood pressure has been normal at home.  He continues to have shoulder pain and is hoping to be able to proceed with surgery now that he has achieved this.              ROS: No fever or chills.  All other systems were reviewed and are negative.  Objective: Vital Signs: BP (!) 130/91 (BP Location: Left Arm, Patient Position: Sitting, Cuff Size: Large)   Pulse 86   Ht 6' (1.829 m)   Wt 242 lb 4 oz (109.9 kg)   BMI 32.86 kg/m   Physical Exam:  General:  Alert and oriented, in no acute distress. Pulm:  Breathing unlabored. Psy:  Normal mood, congruent affect. Skin: No rash CV: Regular rate and rhythm without murmurs, rubs, or gallops.  No peripheral edema.  2+ radial and posterior tibial pulses. Lungs: Clear to auscultation throughout with no wheezing or areas of consolidation.    Imaging: None today  Assessment & Plan: 1.  Diabetes, blood pressure elevation, and obesity, all significantly improved with recent weight loss. -Labs to confirm improvement in diabetes. -Presuming labs look good, he will be cleared to proceed with shoulder surgery.     Procedures: No procedures performed  No notes on file     PMFS History: Patient Active Problem List   Diagnosis Date Noted  . Diabetes ([place]) 08/13/2018  . Class 2 obesity due to excess calories without serious comorbidity with body mass index (BMI) of 39.0 to 39.9 in  adult 08/12/2018   Past Medical History:  Diagnosis Date  . Carpal tunnel syndrome     History reviewed. No pertinent family history.  Past Surgical History:  Procedure Laterality Date  . HERNIA REPAIR     Social History   Occupational History  . Not on file  Tobacco Use  . Smoking status: Never Smoker  . Smokeless tobacco: Never Used  Substance and Sexual Activity  . Alcohol use: No  . Drug use: Not on file  . Sexual activity: Not on file

## 2018-11-16 ENCOUNTER — Telehealth: Payer: Self-pay | Admitting: Family Medicine

## 2018-11-16 LAB — HEMOGLOBIN A1C
Hgb A1c MFr Bld: 5.4 % of total Hgb (ref <5.7)
Mean Plasma Glucose: 108 (calc)
eAG (mmol/L): 6 (calc)

## 2018-11-16 LAB — BASIC METABOLIC PANEL
BUN: 25 mg/dL (ref 7–25)
CO2: 27 mmol/L (ref 20–32)
Calcium: 10.1 mg/dL (ref 8.6–10.3)
Chloride: 102 mmol/L (ref 98–110)
Creat: 1 mg/dL (ref 0.70–1.33)
Glucose, Bld: 80 mg/dL (ref 65–99)
Potassium: 3.9 mmol/L (ref 3.5–5.3)
Sodium: 141 mmol/L (ref 135–146)

## 2018-11-16 LAB — LIPID PANEL
Cholesterol: 164 mg/dL (ref <200)
HDL: 33 mg/dL — ABNORMAL LOW (ref >=40)
LDL Cholesterol (Calc): 102 mg/dL (calc) — ABNORMAL HIGH
Non-HDL Cholesterol (Calc): 131 mg/dL (calc) — ABNORMAL HIGH (ref <130)
Total CHOL/HDL Ratio: 5 (calc) — ABNORMAL HIGH (ref <5.0)
Triglycerides: 172 mg/dL — ABNORMAL HIGH (ref <150)

## 2018-11-16 NOTE — Telephone Encounter (Signed)
Awesome! Thanks

## 2018-11-16 NOTE — Telephone Encounter (Signed)
Labs are notable for the following:  Blood glucose is now normal at 80, and A1c is now normal at 5.4.  Lipid panel is almost completely normal.  Cleared to proceed with surgery.

## 2018-11-17 NOTE — Telephone Encounter (Signed)
I called patient.  He wants to come by next Tuesday a.m. to speak with me and schedule surgery.

## 2018-11-23 ENCOUNTER — Encounter: Payer: Self-pay | Admitting: Orthopaedic Surgery

## 2018-11-29 ENCOUNTER — Other Ambulatory Visit: Payer: Self-pay

## 2018-11-29 ENCOUNTER — Encounter (HOSPITAL_BASED_OUTPATIENT_CLINIC_OR_DEPARTMENT_OTHER)
Admission: RE | Admit: 2018-11-29 | Discharge: 2018-11-29 | Disposition: A | Discharge status: Home / Self Care | Payer: Federal, State, Local not specified - PPO | Source: Ambulatory Visit | Attending: Orthopaedic Surgery | Admitting: Orthopaedic Surgery

## 2018-11-29 ENCOUNTER — Encounter (HOSPITAL_BASED_OUTPATIENT_CLINIC_OR_DEPARTMENT_OTHER): Payer: Self-pay | Admitting: *Deleted

## 2018-11-29 DIAGNOSIS — Z01812 Encounter for preprocedural laboratory examination: Secondary | ICD-10-CM | POA: Insufficient documentation

## 2018-11-29 LAB — BASIC METABOLIC PANEL
Anion gap: 12 (ref 5–15)
BUN: 28 mg/dL — ABNORMAL HIGH (ref 6–20)
CO2: 27 mmol/L (ref 22–32)
Calcium: 9.5 mg/dL (ref 8.9–10.3)
Chloride: 101 mmol/L (ref 98–111)
Creatinine, Ser: 1.11 mg/dL (ref 0.61–1.24)
GFR calc Af Amer: >60 mL/min (ref >60)
GFR calc non Af Amer: >60 mL/min (ref >60)
Glucose, Bld: 83 mg/dL (ref 70–99)
Potassium: 4.3 mmol/L (ref 3.5–5.1)
Sodium: 140 mmol/L (ref 135–145)

## 2018-11-29 NOTE — Progress Notes (Signed)

## 2018-12-04 ENCOUNTER — Other Ambulatory Visit (HOSPITAL_COMMUNITY)
Admission: RE | Admit: 2018-12-04 | Discharge: 2018-12-04 | Disposition: A | Discharge status: Home / Self Care | Payer: Federal, State, Local not specified - PPO | Source: Ambulatory Visit | Attending: Orthopaedic Surgery | Admitting: Orthopaedic Surgery

## 2018-12-04 DIAGNOSIS — Z20828 Contact with and (suspected) exposure to other viral communicable diseases: Secondary | ICD-10-CM | POA: Diagnosis not present

## 2018-12-05 LAB — NOVEL CORONAVIRUS, NAA (HOSP ORDER, SEND-OUT TO REF LAB; TAT 18-24 HRS): SARS-CoV-2, NAA: NOT DETECTED

## 2018-12-07 DIAGNOSIS — M75112 Incomplete rotator cuff tear or rupture of left shoulder, not specified as traumatic: Secondary | ICD-10-CM

## 2018-12-07 NOTE — Anesthesia Preprocedure Evaluation (Addendum)
Anesthesia Evaluation  Patient identified by MRN, date of birth, ID band Patient awake    Reviewed: Allergy & Precautions, NPO status , Patient's Chart, lab work & pertinent test results  Airway Mallampati: II  TM Distance: >3 FB Neck ROM: Full    Dental no notable dental hx. (+) Teeth Intact   Pulmonary neg pulmonary ROS,    Pulmonary exam normal breath sounds clear to auscultation       Cardiovascular Exercise Tolerance: Good negative cardio ROS Normal cardiovascular exam Rhythm:Regular Rate:Normal     Neuro/Psych Anxiety negative neurological ROS     GI/Hepatic negative GI ROS, Neg liver ROS,   Endo/Other  diabetes  Renal/GU negative Renal ROSK+ 4.3 Cr 1.11     Musculoskeletal negative musculoskeletal ROS (+)   Abdominal (+) + obese,   Peds  Hematology negative hematology ROS (+)   Anesthesia Other Findings   Reproductive/Obstetrics negative OB ROS                            Anesthesia Physical Anesthesia Plan  ASA: II  Anesthesia Plan: General   Post-op Pain Management:    Induction: Intravenous  PONV Risk Score and Plan: 3 and Treatment may vary due to age or medical condition, Ondansetron and Dexamethasone  Airway Management Planned: Oral ETT  Additional Equipment: None  Intra-op Plan:   Post-operative Plan: Extubation in OR  Informed Consent: I have reviewed the patients History and Physical, chart, labs and discussed the procedure including the risks, benefits and alternatives for the proposed anesthesia with the patient or authorized representative who has indicated his/her understanding and acceptance.     Dental advisory given  Plan Discussed with: CRNA  Anesthesia Plan Comments: (GA w L ISB w exparel)       Anesthesia Quick Evaluation

## 2018-12-08 ENCOUNTER — Ambulatory Visit (HOSPITAL_BASED_OUTPATIENT_CLINIC_OR_DEPARTMENT_OTHER): Payer: Federal, State, Local not specified - PPO | Admitting: Anesthesiology

## 2018-12-08 ENCOUNTER — Ambulatory Visit (HOSPITAL_BASED_OUTPATIENT_CLINIC_OR_DEPARTMENT_OTHER)
Admission: RE | Admit: 2018-12-08 | Discharge: 2018-12-08 | Disposition: A | Discharge status: Home / Self Care | Payer: Federal, State, Local not specified - PPO | Attending: Orthopaedic Surgery | Admitting: Orthopaedic Surgery

## 2018-12-08 ENCOUNTER — Other Ambulatory Visit: Payer: Self-pay

## 2018-12-08 ENCOUNTER — Encounter (HOSPITAL_BASED_OUTPATIENT_CLINIC_OR_DEPARTMENT_OTHER)
Admission: RE | Disposition: A | Discharge status: Home / Self Care | Payer: Self-pay | Source: Home / Self Care | Attending: Orthopaedic Surgery

## 2018-12-08 ENCOUNTER — Encounter (HOSPITAL_BASED_OUTPATIENT_CLINIC_OR_DEPARTMENT_OTHER): Payer: Self-pay | Admitting: Anesthesiology

## 2018-12-08 DIAGNOSIS — M75112 Incomplete rotator cuff tear or rupture of left shoulder, not specified as traumatic: Secondary | ICD-10-CM

## 2018-12-08 DIAGNOSIS — M94212 Chondromalacia, left shoulder: Secondary | ICD-10-CM | POA: Insufficient documentation

## 2018-12-08 DIAGNOSIS — M7542 Impingement syndrome of left shoulder: Secondary | ICD-10-CM | POA: Diagnosis present

## 2018-12-08 DIAGNOSIS — Z91030 Bee allergy status: Secondary | ICD-10-CM | POA: Diagnosis not present

## 2018-12-08 DIAGNOSIS — Z79899 Other long term (current) drug therapy: Secondary | ICD-10-CM | POA: Diagnosis not present

## 2018-12-08 DIAGNOSIS — M75102 Unspecified rotator cuff tear or rupture of left shoulder, not specified as traumatic: Secondary | ICD-10-CM | POA: Diagnosis present

## 2018-12-08 HISTORY — PX: SHOULDER ARTHROSCOPY WITH SUBACROMIAL DECOMPRESSION: SHX5684

## 2018-12-08 HISTORY — DX: Anxiety disorder, unspecified: F41.9

## 2018-12-08 SURGERY — SHOULDER ARTHROSCOPY WITH SUBACROMIAL DECOMPRESSION
Anesthesia: General | Site: Shoulder | Laterality: Left

## 2018-12-08 MED ORDER — LACTATED RINGERS IV SOLN: INTRAVENOUS | Status: DC

## 2018-12-08 MED ORDER — MEPERIDINE HCL 25 MG/ML IJ SOLN: 6.2500 mg | INTRAMUSCULAR | Status: DC | PRN

## 2018-12-08 MED ORDER — CEFAZOLIN SODIUM-DEXTROSE 2-4 GM/100ML-% IV SOLN
INTRAVENOUS | Status: AC
  Filled 2018-12-08: qty 100

## 2018-12-08 MED ORDER — EPHEDRINE SULFATE 50 MG/ML IJ SOLN
INTRAMUSCULAR | Status: DC | PRN
  Administered 2018-12-08: 10 mg via INTRAVENOUS

## 2018-12-08 MED ORDER — FENTANYL CITRATE (PF) 100 MCG/2ML IJ SOLN
INTRAMUSCULAR | Status: AC
  Filled 2018-12-08: qty 2

## 2018-12-08 MED ORDER — DEXAMETHASONE SODIUM PHOSPHATE 10 MG/ML IJ SOLN
INTRAMUSCULAR | Status: AC
  Filled 2018-12-08: qty 1

## 2018-12-08 MED ORDER — ONDANSETRON HCL 4 MG/2ML IJ SOLN
INTRAMUSCULAR | Status: DC | PRN
  Administered 2018-12-08: 4 mg via INTRAVENOUS

## 2018-12-08 MED ORDER — OXYCODONE-ACETAMINOPHEN 5-325 MG PO TABS: 1.0000 | ORAL_TABLET | Freq: Three times a day (TID) | ORAL | 0 refills | Status: AC | PRN

## 2018-12-08 MED ORDER — METHOCARBAMOL 750 MG PO TABS: 750.0000 mg | ORAL_TABLET | Freq: Two times a day (BID) | ORAL | 0 refills | Status: AC | PRN

## 2018-12-08 MED ORDER — CHLORHEXIDINE GLUCONATE 4 % EX LIQD: 60.0000 mL | Freq: Once | CUTANEOUS | Status: DC

## 2018-12-08 MED ORDER — SUGAMMADEX SODIUM 200 MG/2ML IV SOLN
INTRAVENOUS | Status: DC | PRN
  Administered 2018-12-08: 200 mg via INTRAVENOUS

## 2018-12-08 MED ORDER — ACETAMINOPHEN 500 MG PO TABS
ORAL_TABLET | ORAL | Status: AC
  Filled 2018-12-08: qty 2

## 2018-12-08 MED ORDER — LIDOCAINE-EPINEPHRINE 1 %-1:100000 IJ SOLN
INTRAMUSCULAR | Status: DC | PRN
  Administered 2018-12-08: 20 mL

## 2018-12-08 MED ORDER — MIDAZOLAM HCL 2 MG/2ML IJ SOLN
1.0000 mg | INTRAMUSCULAR | Status: DC | PRN
  Administered 2018-12-08: 08:00:00 2 mg via INTRAVENOUS

## 2018-12-08 MED ORDER — ONDANSETRON HCL 4 MG/2ML IJ SOLN: 4.0000 mg | Freq: Once | INTRAMUSCULAR | Status: DC | PRN

## 2018-12-08 MED ORDER — BUPIVACAINE HCL (PF) 0.25 % IJ SOLN
INTRAMUSCULAR | Status: AC
  Filled 2018-12-08: qty 30

## 2018-12-08 MED ORDER — LIDOCAINE HCL (CARDIAC) PF 100 MG/5ML IV SOSY
PREFILLED_SYRINGE | INTRAVENOUS | Status: DC | PRN
  Administered 2018-12-08: 100 mg via INTRAVENOUS

## 2018-12-08 MED ORDER — LIDOCAINE-EPINEPHRINE (PF) 1 %-1:200000 IJ SOLN
INTRAMUSCULAR | Status: AC
  Filled 2018-12-08: qty 30

## 2018-12-08 MED ORDER — LIDOCAINE-EPINEPHRINE 1 %-1:100000 IJ SOLN
INTRAMUSCULAR | Status: AC
  Filled 2018-12-08: qty 1

## 2018-12-08 MED ORDER — OXYCODONE HCL 5 MG PO TABS: 5.0000 mg | ORAL_TABLET | Freq: Once | ORAL | Status: DC | PRN

## 2018-12-08 MED ORDER — LACTATED RINGERS IV SOLN
INTRAVENOUS | Status: DC
  Administered 2018-12-08 (×2): via INTRAVENOUS

## 2018-12-08 MED ORDER — DEXAMETHASONE SODIUM PHOSPHATE 4 MG/ML IJ SOLN
INTRAMUSCULAR | Status: DC | PRN
  Administered 2018-12-08: 5 mg via INTRAVENOUS

## 2018-12-08 MED ORDER — KETOROLAC TROMETHAMINE 10 MG PO TABS: 10.0000 mg | ORAL_TABLET | Freq: Two times a day (BID) | ORAL | 0 refills | Status: AC | PRN

## 2018-12-08 MED ORDER — ONDANSETRON HCL 4 MG/2ML IJ SOLN
INTRAMUSCULAR | Status: AC
  Filled 2018-12-08: qty 2

## 2018-12-08 MED ORDER — PROPOFOL 10 MG/ML IV BOLUS
INTRAVENOUS | Status: DC | PRN
  Administered 2018-12-08: 180 mg via INTRAVENOUS

## 2018-12-08 MED ORDER — CEFAZOLIN SODIUM-DEXTROSE 2-4 GM/100ML-% IV SOLN
2.0000 g | INTRAVENOUS | Status: AC
  Administered 2018-12-08: 2 g via INTRAVENOUS

## 2018-12-08 MED ORDER — OXYCODONE HCL 5 MG/5ML PO SOLN: 5.0000 mg | Freq: Once | ORAL | Status: DC | PRN

## 2018-12-08 MED ORDER — HYDROMORPHONE HCL 1 MG/ML IJ SOLN: 0.2500 mg | INTRAMUSCULAR | Status: DC | PRN

## 2018-12-08 MED ORDER — BUPIVACAINE HCL (PF) 0.5 % IJ SOLN
INTRAMUSCULAR | Status: AC
  Filled 2018-12-08: qty 30

## 2018-12-08 MED ORDER — PROPOFOL 500 MG/50ML IV EMUL
INTRAVENOUS | Status: AC
  Filled 2018-12-08: qty 50

## 2018-12-08 MED ORDER — ROCURONIUM BROMIDE 10 MG/ML (PF) SYRINGE
PREFILLED_SYRINGE | INTRAVENOUS | Status: AC
  Filled 2018-12-08: qty 10

## 2018-12-08 MED ORDER — FENTANYL CITRATE (PF) 100 MCG/2ML IJ SOLN
50.0000 ug | INTRAMUSCULAR | Status: DC | PRN
  Administered 2018-12-08 (×2): 100 ug via INTRAVENOUS

## 2018-12-08 MED ORDER — MIDAZOLAM HCL 2 MG/2ML IJ SOLN
INTRAMUSCULAR | Status: AC
  Filled 2018-12-08: qty 2

## 2018-12-08 MED ORDER — PHENYLEPHRINE HCL (PRESSORS) 10 MG/ML IV SOLN
INTRAVENOUS | Status: DC | PRN
  Administered 2018-12-08: 80 ug via INTRAVENOUS
  Administered 2018-12-08 (×2): 40 ug via INTRAVENOUS
  Administered 2018-12-08: 80 ug via INTRAVENOUS

## 2018-12-08 MED ORDER — ROCURONIUM BROMIDE 100 MG/10ML IV SOLN
INTRAVENOUS | Status: DC | PRN
  Administered 2018-12-08: 50 mg via INTRAVENOUS

## 2018-12-08 MED ORDER — ACETAMINOPHEN 500 MG PO TABS
1000.0000 mg | ORAL_TABLET | Freq: Once | ORAL | Status: AC
  Administered 2018-12-08: 08:00:00 1000 mg via ORAL

## 2018-12-08 SURGICAL SUPPLY — 62 items
BENZOIN TINCTURE PRP APPL 2/3 (GAUZE/BANDAGES/DRESSINGS) IMPLANT
BLADE CUTTER GATOR 3.5 (BLADE) ×3 IMPLANT
BLADE GREAT WHITE 4.2 (BLADE) IMPLANT
BLADE GREAT WHITE 4.2MM (BLADE)
BLADE SURG 15 STRL LF DISP TIS (BLADE) IMPLANT
BLADE SURG 15 STRL SS (BLADE)
BUR OVAL 4.0 (BURR) ×3 IMPLANT
CANNULA 5.75X71 LONG (CANNULA) ×3 IMPLANT
CANNULA SHOULDER 7CM (CANNULA) ×3 IMPLANT
CANNULA TWIST IN 8.25X7CM (CANNULA) IMPLANT
CANNULA TWIST IN 8.25X9CM (CANNULA) IMPLANT
CLOSURE STERI-STRIP 1/2X4 (GAUZE/BANDAGES/DRESSINGS)
CLSR STERI-STRIP ANTIMIC 1/2X4 (GAUZE/BANDAGES/DRESSINGS) IMPLANT
COVER WAND RF STERILE (DRAPES) IMPLANT
DECANTER SPIKE VIAL GLASS SM (MISCELLANEOUS) IMPLANT
DRAPE IMP U-DRAPE 54X76 (DRAPES) ×3 IMPLANT
DRAPE INCISE IOBAN 66X45 STRL (DRAPES) ×3 IMPLANT
DRAPE STERI 35X30 U-POUCH (DRAPES) ×3 IMPLANT
DRAPE SURG 17X23 STRL (DRAPES) ×3 IMPLANT
DRAPE U-SHAPE 47X51 STRL (DRAPES) ×3 IMPLANT
DRAPE U-SHAPE 76X120 STRL (DRAPES) ×6 IMPLANT
DRSG PAD ABDOMINAL 8X10 ST (GAUZE/BANDAGES/DRESSINGS) ×6 IMPLANT
DURAPREP 26ML APPLICATOR (WOUND CARE) ×6 IMPLANT
ELECT REM PT RETURN 9FT ADLT (ELECTROSURGICAL)
ELECTRODE REM PT RTRN 9FT ADLT (ELECTROSURGICAL) IMPLANT
GAUZE SPONGE 4X4 12PLY STRL (GAUZE/BANDAGES/DRESSINGS) ×6 IMPLANT
GAUZE XEROFORM 1X8 LF (GAUZE/BANDAGES/DRESSINGS) ×3 IMPLANT
GLOVE BIOGEL PI IND STRL 7.0 (GLOVE) ×1 IMPLANT
GLOVE BIOGEL PI INDICATOR 7.0 (GLOVE) ×2
GLOVE ECLIPSE 7.0 STRL STRAW (GLOVE) ×3 IMPLANT
GLOVE SKINSENSE NS SZ7.5 (GLOVE) ×2
GLOVE SKINSENSE STRL SZ7.5 (GLOVE) ×1 IMPLANT
GLOVE SURG SYN 7.5  E (GLOVE) ×2
GLOVE SURG SYN 7.5 E (GLOVE) ×1 IMPLANT
GLOVE SURG SYN 7.5 PF PI (GLOVE) ×1 IMPLANT
GOWN STRL REIN XL XLG (GOWN DISPOSABLE) ×3 IMPLANT
GOWN STRL REUS W/ TWL LRG LVL3 (GOWN DISPOSABLE) ×1 IMPLANT
GOWN STRL REUS W/ TWL XL LVL3 (GOWN DISPOSABLE) ×1 IMPLANT
GOWN STRL REUS W/TWL LRG LVL3 (GOWN DISPOSABLE) ×2
GOWN STRL REUS W/TWL XL LVL3 (GOWN DISPOSABLE) ×2
KIT SHOULDER TRACTION (DRAPES) ×3 IMPLANT
MANIFOLD NEPTUNE II (INSTRUMENTS) ×3 IMPLANT
NDL SCORPION MULTI FIRE (NEEDLE) IMPLANT
NEEDLE SCORPION MULTI FIRE (NEEDLE) IMPLANT
PACK ARTHROSCOPY DSU (CUSTOM PROCEDURE TRAY) ×3 IMPLANT
PACK BASIN DAY SURGERY FS (CUSTOM PROCEDURE TRAY) ×3 IMPLANT
PROBE BIPOLAR ATHRO 135MM 90D (MISCELLANEOUS) ×3 IMPLANT
SLEEVE SCD COMPRESS KNEE MED (MISCELLANEOUS) ×3 IMPLANT
SLING ARM FOAM STRAP LRG (SOFTGOODS) IMPLANT
SUT ETHILON 3 0 PS 1 (SUTURE) ×3 IMPLANT
SUT FIBERWIRE #2 38 T-5 BLUE (SUTURE)
SUT TIGER TAPE 7 IN WHITE (SUTURE) IMPLANT
SUTURE FIBERWR #2 38 T-5 BLUE (SUTURE) IMPLANT
SUTURE TAPE TIGERLINK 1.3MM BL (SUTURE) IMPLANT
SUTURETAPE TIGERLINK 1.3MM BL (SUTURE)
SYR 50ML LL SCALE MARK (SYRINGE) IMPLANT
TAPE FIBER 2MM 7IN #2 BLUE (SUTURE) IMPLANT
TOWEL GREEN STERILE FF (TOWEL DISPOSABLE) ×3 IMPLANT
TUBE CONNECTING 20'X1/4 (TUBING)
TUBE CONNECTING 20X1/4 (TUBING) IMPLANT
TUBING ARTHRO INFLOW-ONLY STRL (TUBING) ×3 IMPLANT
WATER STERILE IRR 1000ML POUR (IV SOLUTION) ×3 IMPLANT

## 2018-12-08 NOTE — Anesthesia Postprocedure Evaluation (Signed)
Anesthesia Post Note  Patient: Allen Perry  Procedure(s) Performed: SHOULDER ARTHROSCOPY WITH SUBACROMIAL DECOMPRESSION DEBRIDEMENT (Left Shoulder)     Patient location during evaluation: PACU Anesthesia Type: General Level of consciousness: awake and alert Pain management: pain level controlled Vital Signs Assessment: post-procedure vital signs reviewed and stable Respiratory status: spontaneous breathing, nonlabored ventilation, respiratory function stable and patient connected to nasal cannula oxygen Cardiovascular status: blood pressure returned to baseline and stable Postop Assessment: no apparent nausea or vomiting Anesthetic complications: no    Last Vitals:  Vitals:   12/08/18 1015 12/08/18 1032  BP: 131/82 124/84  Pulse: 75 73  Resp: 17 16  Temp:  36.5 C  SpO2: 96% 97%    Last Pain:  Vitals:   12/08/18 1032  TempSrc: Oral  PainSc: 0-No pain                 Barnet Glasgow

## 2018-12-08 NOTE — Discharge Instructions (Signed)
Post-operative patient instructions  Shoulder Arthroscopy    Ice:  Place intermittent ice or cooler pack over your shoulder, 30 minutes on and 30 minutes off.  Continue this for the first 72 hours after surgery, then save ice for use after therapy sessions or on more active days.    Weight:  You may bear weight on your arm as your symptoms allow.  Motion:  You may perform any shoulder range of motion as tolerated  Dressing:  Perform 1st dressing change at 2 days postoperative. A moderate amount of blood tinged drainage is to be expected.  So if you bleed through the dressing on the first or second day or if you have fevers, it is fine to change the dressing/check the wounds early and redress wound.  If it bleeds through again, or if the incisions are leaking frank blood, please call the office. May change dressing every 1-2 days thereafter to help watch wounds. Can purchase Tegaderm (or 38M Nexcare) water resistant dressings at local pharmacy / [facility].  Shower:  Light shower is ok after 2 days.  Please take shower, NO bath. Recover with gauze and ace wrap to help keep wounds protected.    Pain medication:  A narcotic pain medication has been prescribed.  Take as directed.  Typically you need narcotic pain medication more regularly during the first 3 to 5 days after surgery.  Decrease your use of the medication as the pain improves.  Narcotics can sometimes cause constipation, even after a few doses.  If you have problems with constipation, you can take an over the counter stool softener or light laxative.  If you have persistent problems, please notify your physicians office.  Physical therapy: Additional activity guidelines to be provided by your physician or physical therapist at follow-up visits.   Driving: Do not recommend driving x 2 weeks post surgical, especially if surgery performed on right side. Should not drive while taking narcotic pain medications. It typically takes at least 2  weeks to restore sufficient neuromuscular function for normal reaction times for driving safety.   Call [phone] for questions or problems. Evenings you will be forwarded to the hospital operator.  Ask for the orthopaedic physician on call. Please call if you experience:    o Redness, foul smelling, or persistent drainage from the surgical site  o worsening shoulder pain and swelling not responsive to medication  o any calf pain and or swelling of the lower leg  o temperatures greater than 101.5 F o other questions or concerns   Thank you for allowing [place] to be a part of your care.     No Tylenol until 1:30 PM on 12/08/2018.    Post Anesthesia Home Care Instructions  Activity: Get plenty of rest for the remainder of the day. A responsible individual must stay with you for 24 hours following the procedure.  For the next 24 hours, DO NOT: -Drive a car -Advertising copywriter -Drink alcoholic beverages -Take any medication unless instructed by your physician -Make any legal decisions or sign important papers.  Meals: Start with liquid foods such as gelatin or soup. Progress to regular foods as tolerated. Avoid greasy, spicy, heavy foods. If nausea and/or vomiting occur, drink only clear liquids until the nausea and/or vomiting subsides. Call your physician if vomiting continues.  Special Instructions/Symptoms: Your throat may feel dry or sore from the anesthesia or the breathing tube placed in your throat during surgery. If this causes discomfort, gargle with warm salt  water. The discomfort should disappear within 24 hours.  If you had a scopolamine patch placed behind your ear for the management of post- operative nausea and/or vomiting:  1. The medication in the patch is effective for 72 hours, after which it should be removed.  Wrap patch in a tissue and discard in the trash. Wash hands thoroughly with soap and water. 2. You may remove the patch earlier than 72 hours if you  experience unpleasant side effects which may include dry mouth, dizziness or visual disturbances. 3. Avoid touching the patch. Wash your hands with soap and water after contact with the patch.       Regional Anesthesia Blocks  1. Numbness or the inability to move the "blocked" extremity may last from 3-48 hours after placement. The length of time depends on the medication injected and your individual response to the medication. If the numbness is not going away after 48 hours, call your surgeon.  2. The extremity that is blocked will need to be protected until the numbness is gone and the  Strength has returned. Because you cannot feel it, you will need to take extra care to avoid injury. Because it may be weak, you may have difficulty moving it or using it. You may not know what position it is in without looking at it while the block is in effect.  3. For blocks in the legs and feet, returning to weight bearing and walking needs to be done carefully. You will need to wait until the numbness is entirely gone and the strength has returned. You should be able to move your leg and foot normally before you try and bear weight or walk. You will need someone to be with you when you first try to ensure you do not fall and possibly risk injury.  4. Bruising and tenderness at the needle site are common side effects and will resolve in a few days.  5. Persistent numbness or new problems with movement should be communicated to the surgeon or the [place] [phone] [place] [phone]).     Information for Discharge Teaching: EXPAREL (bupivacaine liposome injectable suspension)   Your surgeon or anesthesiologist gave you EXPAREL(bupivacaine) to help control your pain after surgery.   EXPAREL is a local anesthetic that provides pain relief by numbing the tissue around the surgical site.  EXPAREL is designed to release pain medication over time and can control  pain for up to 72 hours.  Depending on how you respond to EXPAREL, you may require less pain medication during your recovery.  Possible side effects:  Temporary loss of sensation or ability to move in the area where bupivacaine was injected.  Nausea, vomiting, constipation  Rarely, numbness and tingling in your mouth or lips, lightheadedness, or anxiety may occur.  Call your doctor right away if you think you may be experiencing any of these sensations, or if you have other questions regarding possible side effects.  Follow all other discharge instructions given to you by your surgeon or nurse. Eat a healthy diet and drink plenty of water or other fluids.  If you return to the hospital for any reason within 96 hours following the administration of EXPAREL, it is important for health care providers to know that you have received this anesthetic. A teal colored band has been placed on your arm with the date, time and amount of EXPAREL you have received in order to alert and inform your health care providers. Please  leave this armband in place for the full 96 hours following administration, and then you may remove the band.

## 2018-12-08 NOTE — Transfer of Care (Signed)
Immediate Anesthesia Transfer of Care Note  Patient: Allen Perry  Procedure(s) Performed: SHOULDER ARTHROSCOPY WITH SUBACROMIAL DECOMPRESSION DEBRIDEMENT (Left Shoulder)  Patient Location: PACU  Anesthesia Type:GA combined with regional for post-op pain  Level of Consciousness: awake, alert  and oriented  Airway & Oxygen Therapy: Patient Spontanous Breathing and Patient connected to nasal cannula oxygen  Post-op Assessment: Report given to RN and Post -op Vital signs reviewed and stable  Post vital signs: Reviewed and stable  Last Vitals:  Vitals Value Taken Time  BP 140/88 12/08/18 0945  Temp    Pulse 78 12/08/18 0949  Resp 8 12/08/18 0949  SpO2 100 % 12/08/18 0949  Vitals shown include unvalidated device data.  Last Pain:  Vitals:   12/08/18 0946  TempSrc:   PainSc: (P) 0-No pain         Complications: No apparent anesthesia complications

## 2018-12-08 NOTE — Anesthesia Procedure Notes (Signed)
Procedure Name: Intubation Date/Time: 12/08/2018 8:25 AM Performed by: Maryella Shivers, CRNA Pre-anesthesia Checklist: Patient identified, Emergency Drugs available, Suction available and Patient being monitored Patient Re-evaluated:Patient Re-evaluated prior to induction Oxygen Delivery Method: Circle system utilized Preoxygenation: Pre-oxygenation with 100% oxygen Induction Type: IV induction Ventilation: Mask ventilation without difficulty Laryngoscope Size: Mac and 4 Grade View: Grade I Tube type: Oral Tube size: 8.0 mm Number of attempts: 1 Airway Equipment and Method: Stylet and Oral airway Placement Confirmation: ETT inserted through vocal cords under direct vision,  positive ETCO2 and breath sounds checked- equal and bilateral Secured at: 22 cm Tube secured with: Tape Dental Injury: Teeth and Oropharynx as per pre-operative assessment

## 2018-12-08 NOTE — H&P (Signed)
PREOPERATIVE H&P  Chief Complaint: left shoulder rotator cuff tear, impingement  HPI: Allen Perry is a 53 y.o. male who presents for surgical treatment of left shoulder rotator cuff tear, impingement.  He denies any changes in medical history.  Past Medical History:  Diagnosis Date  . Anxiety   . Carpal tunnel syndrome    Past Surgical History:  Procedure Laterality Date  . HERNIA REPAIR     as a baby   Social History   Socioeconomic History  . Marital status: Single    Spouse name: Not on file  . Number of children: Not on file  . Years of education: Not on file  . Highest education level: Not on file  Occupational History  . Not on file  Social Needs  . Financial resource strain: Not on file  . Food insecurity    Worry: Not on file    Inability: Not on file  . Transportation needs    Medical: Not on file    Non-medical: Not on file  Tobacco Use  . Smoking status: Never Smoker  . Smokeless tobacco: Never Used  Substance and Sexual Activity  . Alcohol use: No  . Drug use: Never  . Sexual activity: Not on file  Lifestyle  . Physical activity    Days per week: Not on file    Minutes per session: Not on file  . Stress: Not on file  Relationships  . Social Musician on phone: Not on file    Gets together: Not on file    Attends religious service: Not on file    Active member of club or organization: Not on file    Attends meetings of clubs or organizations: Not on file    Relationship status: Not on file  Other Topics Concern  . Not on file  Social History Narrative  . Not on file   History reviewed. No pertinent family history. Allergies  Allergen Reactions  . Bee Venom Swelling   Prior to Admission medications   Medication Sig Start Date End Date Taking? Authorizing Provider  Ascorbic Acid (VITAMIN C PO) Take 1 tablet by mouth daily.   Yes [provider]  CALCIUM PO Take 1 tablet by mouth daily.   Yes [provider]  Cholecalciferol (VITAMIN D3 PO) Take 1 capsule by mouth daily.   Yes [provider]  Cyanocobalamin (B-12 PO) Take 1 tablet by mouth daily.   Yes [provider]  MAGNESIUM PO Take 1 tablet by mouth daily.   Yes [provider]  Multiple Vitamin (MULTIVITAMIN WITH MINERALS) TABS tablet Take 1 tablet by mouth daily.   Yes [provider]     Positive ROS: All other systems have been reviewed and were otherwise negative with the exception of those mentioned in the HPI and as above.  Physical Exam: General: Alert, no acute distress Cardiovascular: No pedal edema Respiratory: No cyanosis, no use of accessory musculature GI: abdomen soft Skin: No lesions in the area of chief complaint Neurologic: Sensation intact distally Psychiatric: Patient is competent for consent with normal mood and affect Lymphatic: no lymphedema  MUSCULOSKELETAL: exam stable  Assessment: left shoulder rotator cuff tear, impingement  Plan: Plan for Procedure(s): LEFT SHOULDER ARTHROSCOPY WITH DEBRIDEMENT, ROTATOR CUFF REPAIR, DISTAL CLAVICLE EXCISION AND SUBACROMIAL DECOMPRESSION  The risks benefits and alternatives were discussed with the patient including but not limited to the risks of nonoperative treatment, versus surgical intervention including infection, bleeding,  nerve injury,  blood clots, cardiopulmonary complications, morbidity, mortality, among others, and they were willing to proceed.   Eduard Roux, MD   12/08/2018 8:01 AM

## 2018-12-08 NOTE — Progress Notes (Signed)
AssistedDr. Houser with left, ultrasound guided, interscalene  block. Side rails up, monitors on throughout procedure. See vital signs in flow sheet. Tolerated Procedure well.  

## 2018-12-08 NOTE — Op Note (Signed)
   Date of Surgery: 12/08/2018  INDICATIONS: The patient is a 53 year old male with left shoulder pain that has failed conservative treatment;  The patient did consent to the procedure after discussion of the risks and benefits.  PREOPERATIVE DIAGNOSIS:  1. Left rotator cuff tendinopathy with partial articular sided tear of supraspinatus 2. Left glenohumeral chondromalacia 3.  Left shoulder impingement syndrome  POSTOPERATIVE DIAGNOSIS: Same.  PROCEDURE:  1.  Arthroscopic left shoulder extensive debridement of glenohumeral joint, labrum, rotator interval, rotator cuff 2.  Arthroscopic left shoulder subacromial decompression and acromioplasty with CA ligament release  SURGEON: N. Eduard Roux, M.D.  ASSIST: Ciro Backer [place], [place]; necessary for the timely completion of procedure and due to complexity of procedure..  ANESTHESIA:  general, regional  IV FLUIDS AND URINE: See anesthesia.  ESTIMATED BLOOD LOSS: minimal mL.  IMPLANTS: None  COMPLICATIONS: None.  DESCRIPTION OF PROCEDURE: The patient was brought to the operating room and placed supine on the operating table.  The patient had been signed prior to the procedure and this was documented. The patient had the anesthesia placed by the anesthesiologist.  A time-out was performed to confirm that this was the correct patient, site, side and location. The patient did receive antibiotics prior to the incision and was re-dosed during the procedure as needed at indicated intervals.  The patient was then moved into the lateral position with the operative extremity suspended in the fishing pole mechanism. The patient had the operative extremity prepped and draped in the standard surgical fashion.    Incisions were made for shoulder arthroscopy portals.  Diagnostic shoulder arthroscopy was first performed.  Debridement of the rotator interval, labrum articular surface of the rotator cuff, glenoid surface was performed with an oscillating  shaver.  The proximal biceps and superior labral complex were unremarkable.  The attachment of the rotator cuff to the greater tuberosity was intact.  We then reposition the arthroscope into the subacromial space.  A bursectomy was performed.  Subacromial decompression with acromioplasty was performed.  We then gently debrided the bursal surface of the rotator cuff and this was also probed for any occult tears.  There was no evidence of any full-thickness tears.  Excess fluid was then removed.  Incisions were closed with interrupted nylon sutures.  Sterile dressings were applied.  Shoulder sling was placed.  Patient tolerated the procedure well had no immediate complications.  POSTOPERATIVE PLAN: Discharge home.  Follow-up in 1 week.  Weight-bear as tolerated.  Sling for comfort.  Azucena Cecil, MD [facility] 9:31 AM

## 2018-12-09 ENCOUNTER — Encounter (HOSPITAL_BASED_OUTPATIENT_CLINIC_OR_DEPARTMENT_OTHER): Payer: Self-pay | Admitting: Orthopaedic Surgery

## 2018-12-16 ENCOUNTER — Other Ambulatory Visit: Payer: Self-pay

## 2018-12-16 ENCOUNTER — Encounter: Payer: Self-pay | Admitting: Orthopaedic Surgery

## 2018-12-16 ENCOUNTER — Ambulatory Visit (INDEPENDENT_AMBULATORY_CARE_PROVIDER_SITE_OTHER): Payer: Federal, State, Local not specified - PPO | Admitting: Physician Assistant

## 2018-12-16 DIAGNOSIS — Z9889 Other specified postprocedural states: Secondary | ICD-10-CM

## 2018-12-16 NOTE — Progress Notes (Signed)
   Post-Op Visit Note   Patient: Allen Perry           Date of Birth: 1965-07-06           MRN: [id number] Visit Date: 12/16/2018 PCP: Eunice Blase, MD   Assessment & Plan:  Chief Complaint: No chief complaint on file.  Visit Diagnoses:  1. S/P arthroscopy of left shoulder     Plan: Patient is a pleasant 53 year old gentleman who presents to our clinic today 1 week status post left shoulder arthroscopic decompression and debridement, date of surgery 12/08/2018.  He has been doing well.  No fevers or chills.  He has been taking minimal narcotics.  He did notice what appears to be a heat rash which started yesterday.  Examination of the left shoulder reveals well-healing surgical portals with nylon sutures in place.  He does have a fair amount of diffuse ecchymosis throughout the upper arm.  He has what looks like a heat rash to the armpit and antecubital fossa.  He is neurovascularly intact distally.  At this point, he can discontinue his sling.  He has been instructed to keep his arm dry as much as possible to help with the rash.  We will go ahead and start him in formal physical therapy.  This was put into the computer today.  He will follow-up with [place] in 5 weeks time for repeat evaluation.  Call with concerns or questions.  Follow-Up Instructions: Return in about 5 weeks (around 01/20/2019).   Orders:  Orders Placed This Encounter  Procedures  . Ambulatory referral to Physical Therapy   No orders of the defined types were placed in this encounter.   Imaging: No new imaging  PMFS History: Patient Active Problem List   Diagnosis Date Noted  . Nontraumatic incomplete tear of left rotator cuff 12/07/2018  . Diabetes ([place]) 08/13/2018  . Class 2 obesity due to excess calories without serious comorbidity with body mass index (BMI) of 39.0 to 39.9 in adult 08/12/2018   Past Medical History:  Diagnosis Date  . Anxiety   . Carpal tunnel syndrome     History reviewed. No  pertinent family history.  Past Surgical History:  Procedure Laterality Date  . HERNIA REPAIR     as a baby  . SHOULDER ARTHROSCOPY WITH SUBACROMIAL DECOMPRESSION Left 12/08/2018   Procedure: SHOULDER ARTHROSCOPY WITH SUBACROMIAL DECOMPRESSION DEBRIDEMENT;  Surgeon: Leandrew Koyanagi, MD;  Location: [place];  Service: Orthopedics;  Laterality: Left;   Social History   Occupational History  . Not on file  Tobacco Use  . Smoking status: Never Smoker  . Smokeless tobacco: Never Used  Substance and Sexual Activity  . Alcohol use: No  . Drug use: Never  . Sexual activity: Not on file

## 2019-01-20 ENCOUNTER — Other Ambulatory Visit: Payer: Self-pay

## 2019-01-20 ENCOUNTER — Ambulatory Visit (INDEPENDENT_AMBULATORY_CARE_PROVIDER_SITE_OTHER): Payer: Federal, State, Local not specified - PPO | Admitting: Orthopaedic Surgery

## 2019-01-20 DIAGNOSIS — Z9889 Other specified postprocedural states: Secondary | ICD-10-CM

## 2019-01-20 NOTE — Progress Notes (Signed)
Patient ID: Allen Perry, male   DOB: [birth year], 53 y.o.   MRN: [id number]  Ameet is here for his 6-week postop visit status post left shoulder scope and decompression.  He is [facility] doing [facility] at [facility].  He is doing very well and his range of motion has essentially returned back to normal.  He is doing home exercises and has a dull ache at times and some occasional stiffness with cold temperatures.  He is not taking any medications.  He has lost 50 initially saw him in the office.  His surgical scars are fully healed.  He is essentially normal range of motion of the shoulder.  He has good strength overall for his 6-week follow-up.  At this point he will begin the strengthening portion of his rehab.  I would like to recheck him in 6 weeks.  Questions encouraged and answered.

## 2019-03-04 ENCOUNTER — Other Ambulatory Visit: Payer: Self-pay

## 2019-03-04 ENCOUNTER — Encounter: Payer: Self-pay | Admitting: Orthopaedic Surgery

## 2019-03-04 ENCOUNTER — Ambulatory Visit: Payer: Federal, State, Local not specified - PPO | Admitting: Physician Assistant

## 2019-03-04 VITALS — Ht 72.0 in

## 2019-03-04 DIAGNOSIS — Z9889 Other specified postprocedural states: Secondary | ICD-10-CM

## 2019-03-04 NOTE — Progress Notes (Signed)
   Post-Op Visit Note   Patient: Allen Perry           Date of Birth: [birth year]           MRN: [id number] Visit Date: 03/04/2019 PCP: Lavada Mesi, MD   Assessment & Plan:  Chief Complaint:  Chief Complaint  Patient presents with  . Left Shoulder - Follow-up    Left shoulder scope DOS 12/08/2018   Visit Diagnoses:  1. S/P arthroscopy of left shoulder     Plan: Patient is a pleasant 54 year old gentleman who presents our clinic today 12 weeks status post left shoulder arthroscopic debridement decompression, date of surgery 12/08/2018.  He has been in physical therapy but has finished.  He regained full range of motion and strength.  The only complaint he has is slight achiness with change in weather.  He understands this is likely due to underlying arthritis.  Overall, he is doing well and ready to return to work for the [employer].  Examination of his left shoulder reveals full active range of motion and strength in all planes.  He is neurovascular intact distally.  At this point, he will continue to advance with activity as tolerated.  He will return to work next Friday per previous notes.  Follow-up with [place] as needed.  Follow-Up Instructions: Return if symptoms worsen or fail to improve.   Orders:  No orders of the defined types were placed in this encounter.  No orders of the defined types were placed in this encounter.   Imaging: No new imaging  PMFS History: Patient Active Problem List   Diagnosis Date Noted  . Nontraumatic incomplete tear of left rotator cuff 12/07/2018  . Diabetes ([facility]) 08/13/2018  . Class 2 obesity due to excess calories without serious comorbidity with body mass index (BMI) of 39.0 to 39.9 in adult 08/12/2018   Past Medical History:  Diagnosis Date  . Anxiety   . Carpal tunnel syndrome     History reviewed. No pertinent family history.  Past Surgical History:  Procedure Laterality Date  . HERNIA REPAIR     as a baby  . SHOULDER  ARTHROSCOPY WITH SUBACROMIAL DECOMPRESSION Left 12/08/2018   Procedure: SHOULDER ARTHROSCOPY WITH SUBACROMIAL DECOMPRESSION DEBRIDEMENT;  Surgeon: Tarry Kos, MD;  Location: [facility];  Service: Orthopedics;  Laterality: Left;   Social History   Occupational History  . Not on file  Tobacco Use  . Smoking status: Never Smoker  . Smokeless tobacco: Never Used  Substance and Sexual Activity  . Alcohol use: No  . Drug use: Never  . Sexual activity: Not on file

## 2019-11-22 IMAGING — MR MRI OF THE LEFT SHOULDER WITHOUT CONTRAST
4 of 5 series · 19 of 40 positions shown · non-contrast
Comparison: Plain films left shoulder from [REDACTED]
07/25/2015.

CLINICAL DATA: Left shoulder pain for 8 months.  No known injury.

EXAM:
MRI OF THE LEFT SHOULDER WITHOUT CONTRAST
TECHNIQUE: Multiplanar, multisequence MR imaging of the shoulder was performed.
No intravenous contrast was administered.

[Series 6: PD fat-sat · axial · left · 4.0mm · 0.50mm/px · z∈[-72,+15]mm · 6 of 20 slices shown (1 of 2)]
[im 1/20]
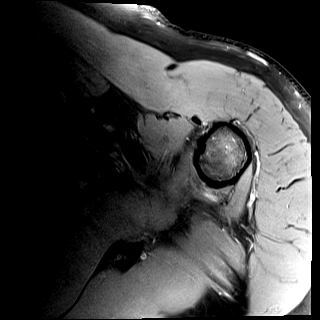
[im 4/20]
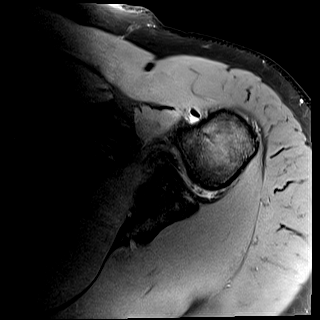
[im 8/20]
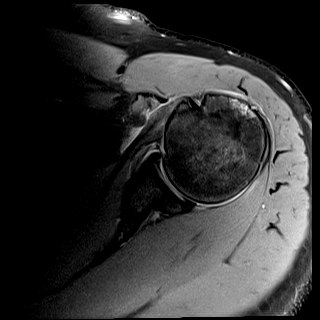
[im 12/20]
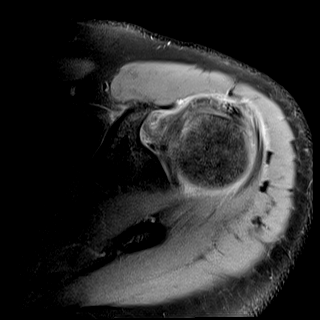
[im 16/20]
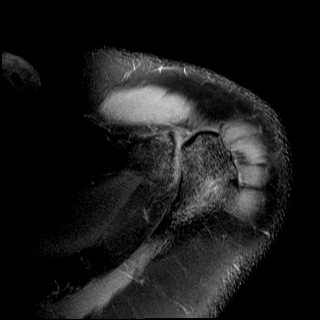
[im 20/20]
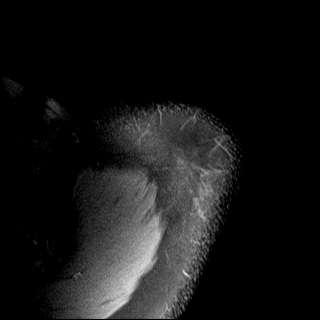

[Series 7: T2 fat-sat · oblique · left · 4.0mm · 0.22mm/px · 3 of 21 slices shown (1 of 2)]
[im 3/21]
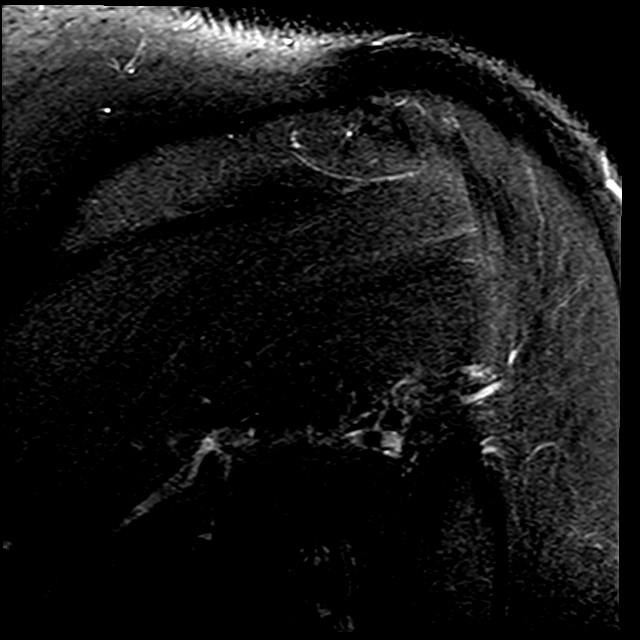
[im 12/21]
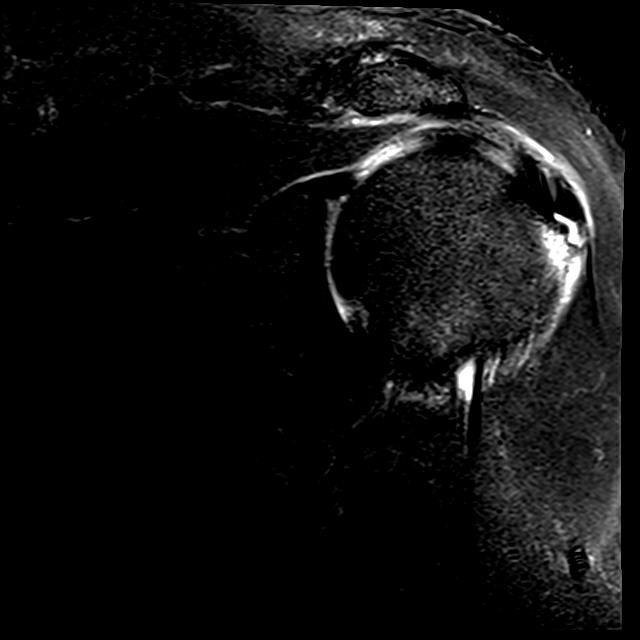
[im 18/21]
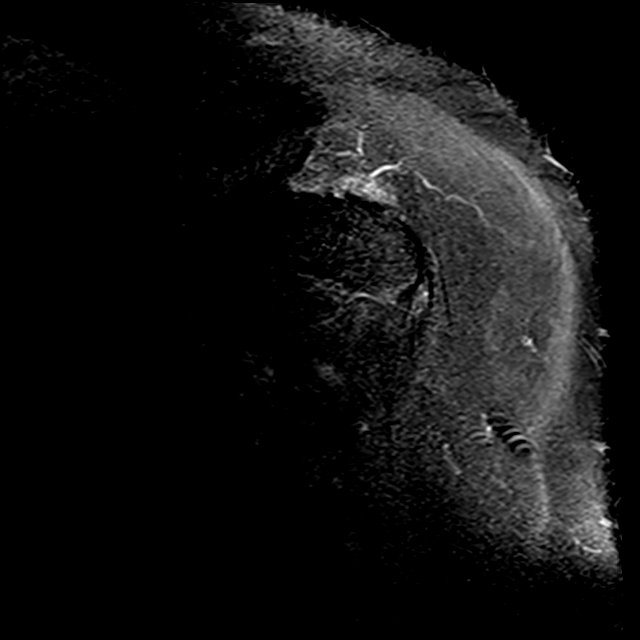

[Series 8: PD fat-sat · oblique · left · 4.0mm · 0.22mm/px · 7 of 21 slices shown (2 of 2)]
[im 1/21]
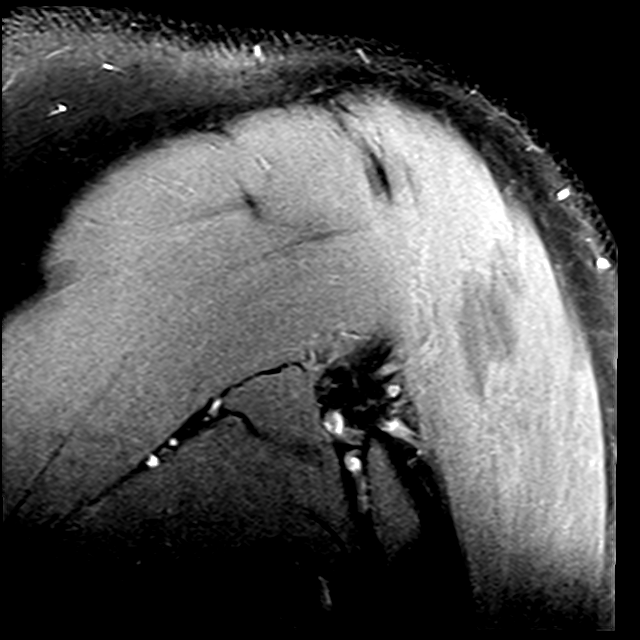
[im 3/21]
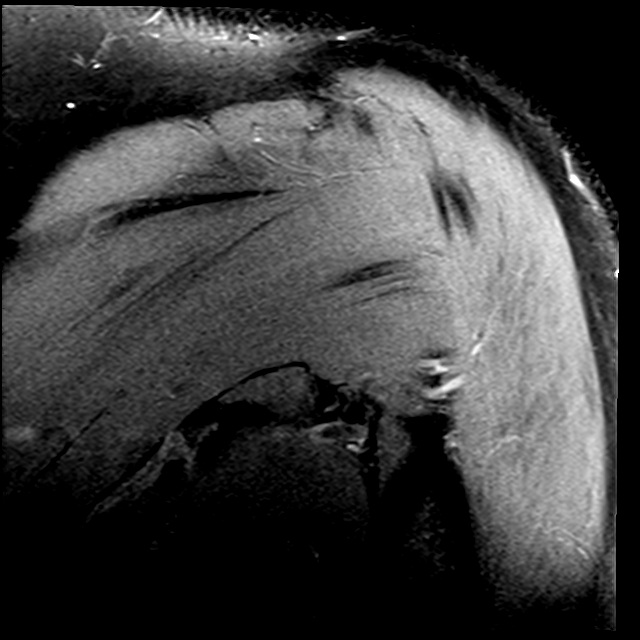
[im 6/21]
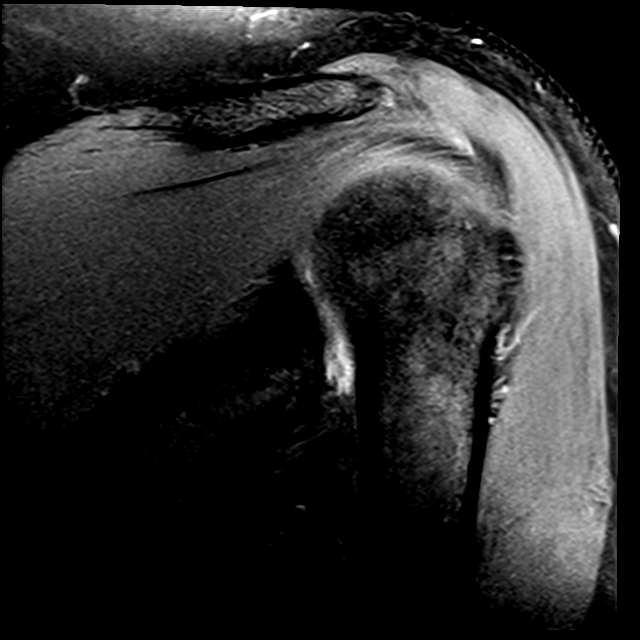
[im 9/21]
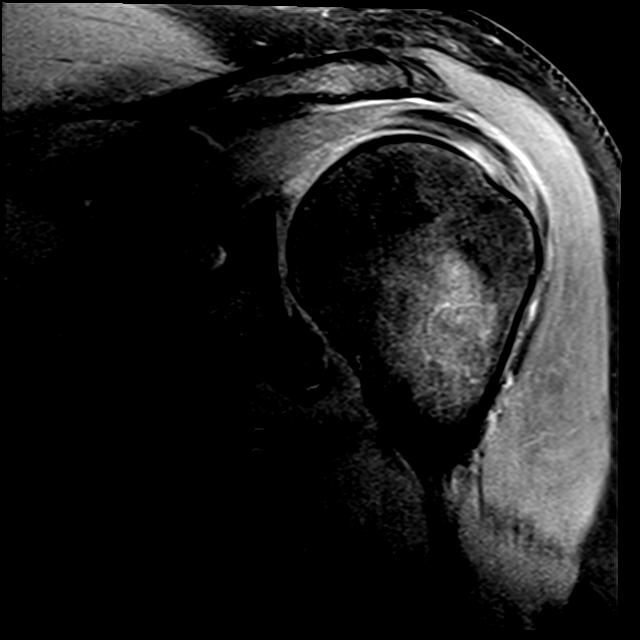
[im 12/21]
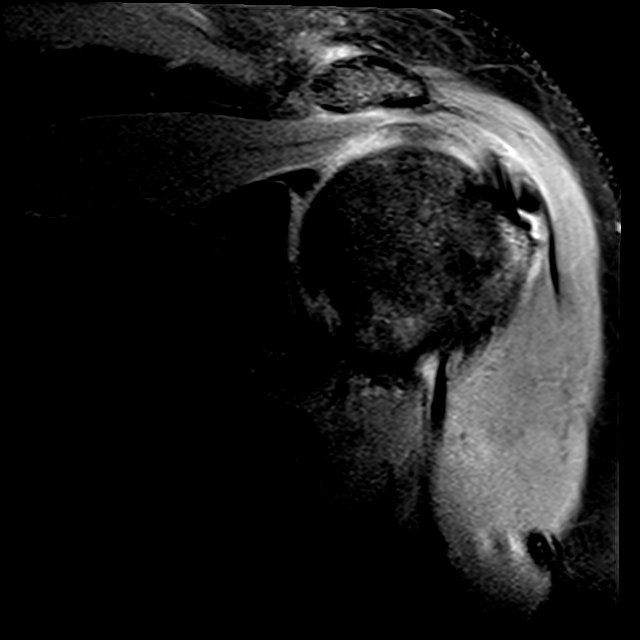
[im 15/21]
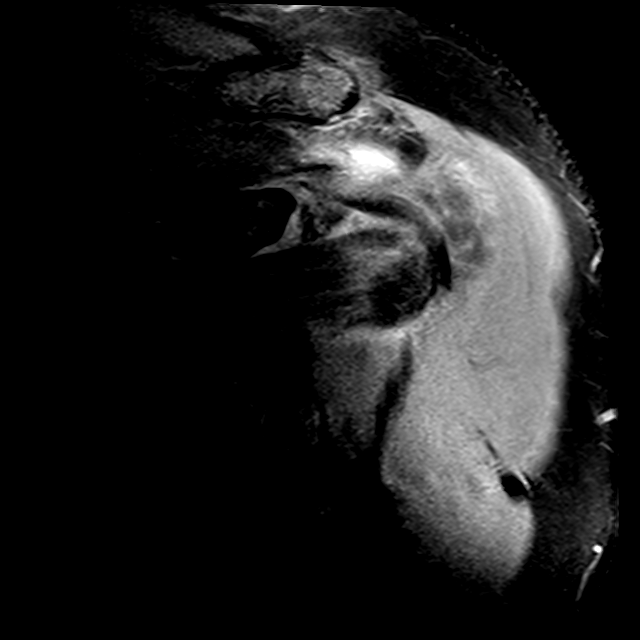
[im 18/21]
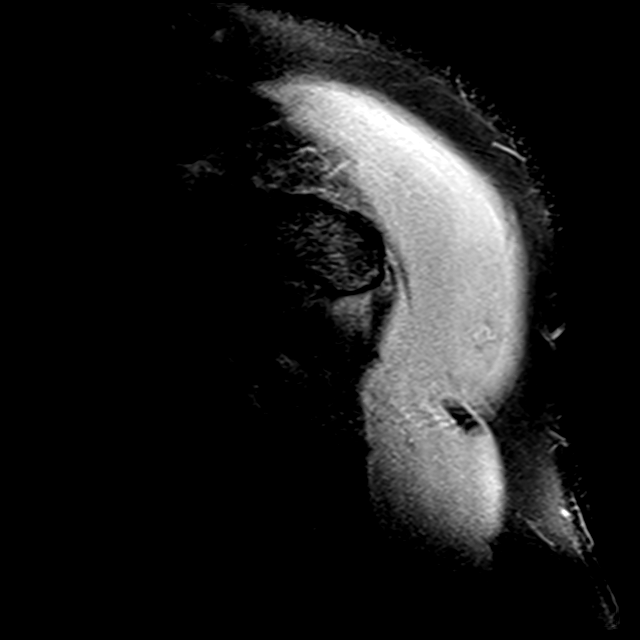

[Series 9: T2 fat-sat · oblique · left · 4.0mm · 0.44mm/px · 3 of 23 slices shown (2 of 2)]
[im 3/23]
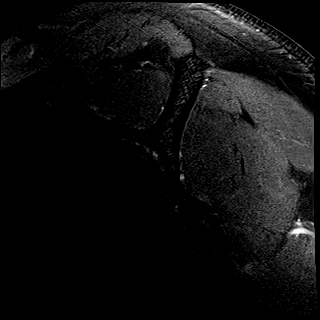
[im 12/23]
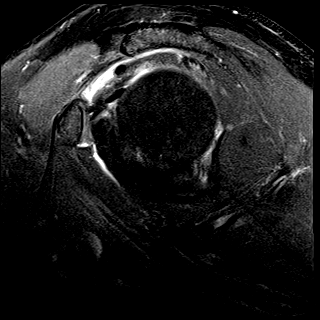
[im 20/23]
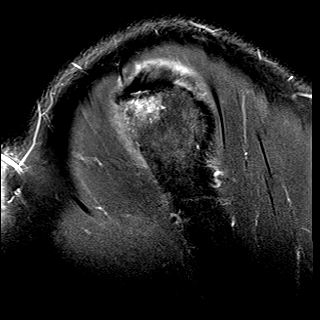

[19 of 40 positions shown; findings below may reference images not displayed]

FINDINGS: Rotator cuff: There is thickening and heterogeneously increased T2
signal in the rotator cuff tendons. A shallow undersurface tear of
the anterior and far lateral supraspinatus measures 0.5 cm from
front to back. No retraction.

Muscles:  Normal without atrophy or focal lesion.

Biceps long head:  Intact.

Acromioclavicular Joint: Mild-to-moderate osteoarthritis. Type 2
acromion. There is some fluid in the subacromial/subdeltoid bursa.

Glenohumeral Joint: Appears normal.

Labrum:  Intact.

Bones:  No fracture, contusion or worrisome lesion.

Other: None.
IMPRESSION: Rotator cuff tendinopathy with a shallow undersurface tear of the
far lateral supraspinatus measuring 0.5 cm from front to back. No
retraction or atrophy.

Mild to moderate acromioclavicular osteoarthritis.

Subacromial/subdeltoid bursitis.

## 2023-03-31 NOTE — H&P (Signed)
 Admission H&P - 03/31/2023  [facility] - Admission Date: 03/31/2023  Assessment & Plan   Retinal detachment, left eye   Problem List: There are no active hospital problems to display for this patient.        Code Status: No Order  History of Present Illness    Plan for vitrectomy, laser, fluid/gas exchange left eye   Review of Systems   Review of Systems  Constitutional: Negative.   HENT: Negative.    Eyes:  Positive for visual disturbance. Negative for photophobia, pain, discharge, redness and itching.  Respiratory: Negative.    Cardiovascular: Negative.   Gastrointestinal: Negative.   Endocrine: Negative.   Genitourinary: Negative.   Musculoskeletal: Negative.   Neurological: Negative.     Home Medications   Home Medication List - Marked as Reviewed on 03/31/23 1011  Medication Sig  acetaminophen  (TYLENOL ) 500 mg PO TABS Take 1 Tab by Mouth Take As Needed for Mild Pain (Pain Score 1-3).    Allergies   Allergies  Allergen Reactions   Bee Stings swelling    Past Surgical History   Past Surgical History:  Procedure Laterality Date   ARTHROSCOPY SHOULDER Left    Oct 2019   EYE SURGERY Bilateral    Lasix    Past Medical History  History reviewed. No pertinent past medical history.  Family History   Family History  Problem Relation Age of Onset   Diabetes Mother    Hypertension Mother    Stroke Father    Diabetes Father     Social History   Social History   Occupational History   Not on file  Tobacco Use   Smoking status: Never   Smokeless tobacco: Never  Vaping Use   Vaping status: Never Used  Substance and Sexual Activity   Alcohol use: Never   Drug use: Never   Sexual activity: Not on file    E-Cigarette Use: Never User  Start Date:   Quit Date:   Passive Exposure:   Counseling Given:   Comments:   Nicotine:   THC:   CBD:   Flavoring:   Other:      Physical Exam   Vital Signs:  BP: 155/98 (R Arm 169-97)  Heart Rate: (not recorded)  Pulse: 80 (03/31/23 1000)  Temp: 97.7 F (36.5 C)  Resp: 17  Height: 6' (182.9 cm)  Weight: 116.8 kg (257 lb 8 oz)  BMI (Calculated): 34.92  SpO2: 95 %     Temp (24hrs), Avg:97.7 F (36.5 C), Min:97.7 F (36.5 C), Max:97.7 F (36.5 C)    Physical Exam Constitutional:      Appearance: Normal appearance.  HENT:     Head: Normocephalic.  Cardiovascular:     Rate and Rhythm: Normal rate and regular rhythm.  Pulmonary:     Effort: Pulmonary effort is normal.     Breath sounds: Normal breath sounds.  Abdominal:     General: Abdomen is flat.  Musculoskeletal:        General: Normal range of motion.  Neurological:     General: No focal deficit present.     Mental Status: He is alert.  Psychiatric:        Mood and Affect: Mood normal.        Behavior: Behavior normal.      Recent Results & Studies   I have reviewed the following pertinent result(s). All labs in past 24hrs: No results found for this visit on 03/31/23 (from the past  24 hours).  Medical Decision Making        Allen JAYSON Poisson, MD  03/31/2023, 10:47 AM

## 2023-03-31 NOTE — Procedures (Signed)
 Operative Note  Patient: Allen Perry MRN: [id number] Surgery Date:  03/31/2023   Procedure  Primary Surgeon   VITRECTOMY, POSTERIOR PARS PLANA APPROACH, WITH AIR/GAS/FLUID EXCHANGE, AND LASER PHOTOCOAGULATION (Left)  Gretel Lang BROCKS, MD   Surgeons and Role:    DEWAINE Gretel Lang BROCKS, MD - Primary  Tasks performed by assistant(s):  Scleral Depression  Other OR Staff/Assistants: No operative assistants  Case Tracking Events     Event Time In   Procedure Start 03/31/2023 1106   Procedure End 03/31/2023 1137        Pre-operative Diagnosis:  Retinal detachment of left eye with single break  Post-operative Diagnosis: same as preop diagnosis  Anesthesia Type: Monitor Anesthesia Care   Complications: None  EBL: 0cc  Specimens: No specimens  No implants or grafts   Description of procedure:  Allen Perry was met in the preoperative area where the left eye was marked as the correct operative eye.  The risks of surgery including the risk of hemorrhage, pressure problems, infection, recurrent retinal tears or detachements, cataract, diplopia, anisocoria, postoperative discomfort, loss of vision and loss of the eye.  The patient also understands that the final postoperative vision is expected to improve but is impossible to predict.  The patient expressed understanding of these risks and agreed to proceed with surgery. The patient was then brought back to the operating room where a careful timeout was performed.  Sedation was then administered by the anesthesia team and a retrobulbar block consisting of 0.5% marcaine  and 2% lidocaine  was placed in the left retrobulbar space using a blunt tipped retrobulbar needle.  The eye was then prepped and draped in the usual sterile ophthalmic fashion.  The lid speculum was placed and the operating microscope was moved into position.  Three 23G trocars were placed in the usual positions 3.60mm posterior to the limbus.  The infusion canula was placed  through the inferotemporal trocar and confirmed to be in the vitreous cavity prior to starting infusion.  The BIOM was moved into position.  The vitrector was then used to perform a complete vitrectomy with careful attention paid to shaving the vitreous base 360 degrees.  The posterior hyloid was engaged and confirmed to be released from the posterior pole.  The retinal detachement was observed extending from 10:00 to 4:00 with the macula attached.  Retinal breaks were identified at 11:00 and 2:00 with additional small tears associated with lattice degeneration in attached retina inferiorly.  The scleral depressor was used to inspect the peripheral retina and no additional breaks were identified.  The intraocular diathermy was then used to mark the retinal breaks and also to create a posterior retinotomy superior to the macula.  The soft tip canula was then used to perform a fluid/air exchange with a combination of active and passive aspiration, draining at the retinotomy.  The retina was seen to flatten completely.  The endolaser probe was then used to surround each of the retinal breaks with 3-4 rows of concentric laser.  Additional laser was placed along the vitreous base 360 degrees.  Residual vitreous fluid was then removed using the soft tip canula with passive aspiration at the retinotomy, and then the retinotomy was sealed with gentle laser treatment .  An air/gas exchange was then performed with 16% C3F8 gas.  The canulas were removed and the sclerotomies were found to be self-sealing.  The interocular pressure was checked and found to be physiologic.  Subconjectival ancef  and dexamethasone  were placed.  The lid speculum  and drapes were removed.  The eye was cleaned, dressed with atropine drops and tobradex ointment, then covered with a patch and shield.  The patient will maintain a facedown position until they are seen tomorrow for their postop appointment.  Jacob C Meyer, MD  Electronically signed on  03/31/2023, 11:43 AM

## 2024-02-10 ENCOUNTER — Other Ambulatory Visit: Payer: Self-pay

## 2024-02-10 ENCOUNTER — Emergency Department (HOSPITAL_COMMUNITY)
Admission: EM | Admit: 2024-02-10 | Discharge: 2024-02-10 | Disposition: A | Discharge status: Home / Self Care | Attending: Emergency Medicine | Admitting: Emergency Medicine

## 2024-02-10 DIAGNOSIS — H53451 Other localized visual field defect, right eye: Secondary | ICD-10-CM

## 2024-02-10 DIAGNOSIS — H547 Unspecified visual loss: Secondary | ICD-10-CM | POA: Insufficient documentation

## 2024-02-10 NOTE — ED Provider Notes (Signed)
 " [place] [facility] Provider Note   CSN: [id number] Arrival date & time: 02/10/24  1436     Patient presents with: Eye Problem   Allen Perry is a 58 y.o. male who presents with decreased vision in the right side of his peripheral vision.  This has been a chronic change for him, related to previous laser eye surgery.  Has attempted to get into ophthalmology however has been unsuccessful as appointments are not available until after the new year.  Does not have any headache, dizziness, or any gait abnormality.    Eye Problem      Prior to Admission medications  Medication Sig Start Date End Date Taking? Authorizing Provider  Ascorbic Acid (VITAMIN C PO) Take 1 tablet by mouth daily.    [provider]  CALCIUM PO Take 1 tablet by mouth daily.    [provider]  Cholecalciferol (VITAMIN D3 PO) Take 1 capsule by mouth daily.    [provider]  Cyanocobalamin (B-12 PO) Take 1 tablet by mouth daily.    [provider]  ketorolac  (TORADOL ) 10 MG tablet Take 1 tablet (10 mg total) by mouth 2 (two) times daily as needed. 12/08/18   Jerri Kay HERO, MD  MAGNESIUM PO Take 1 tablet by mouth daily.    [provider]  methocarbamol  (ROBAXIN ) 750 MG tablet Take 1 tablet (750 mg total) by mouth 2 (two) times daily as needed for muscle spasms. 12/08/18   Jerri Kay HERO, MD  Multiple Vitamin (MULTIVITAMIN WITH MINERALS) TABS tablet Take 1 tablet by mouth daily.    [provider]  oxyCODONE -acetaminophen  (PERCOCET) 5-325 MG tablet Take 1-2 tablets by mouth every 8 (eight) hours as needed for severe pain. 12/08/18   Jerri Kay HERO, MD    Allergies: Bee venom    Review of Systems  Eyes:  Positive for visual disturbance.  All other systems reviewed and are negative.   Updated Vital Signs BP (!) 176/110   Pulse 72   Temp 98.9 F (37.2 C)   Resp 18   Wt 107 kg   SpO2 96%   BMI 31.99 kg/m    Physical Exam Vitals and nursing note reviewed.  Constitutional:      General: He is not in acute distress.    Appearance: He is well-developed.  HENT:     Head: Normocephalic and atraumatic.  Eyes:     General: Lids are normal. Vision grossly intact. Gaze aligned appropriately.     Conjunctiva/sclera: Conjunctivae normal.     Pupils: Pupils are equal, round, and reactive to light.     Funduscopic exam:    Right eye: No exudate, AV nicking or papilledema.        Left eye: No exudate, AV nicking or papilledema.  Cardiovascular:     Rate and Rhythm: Normal rate and regular rhythm.     Heart sounds: No murmur heard. Pulmonary:     Effort: Pulmonary effort is normal. No respiratory distress.     Breath sounds: Normal breath sounds.  Abdominal:     Palpations: Abdomen is soft.     Tenderness: There is no abdominal tenderness.  Musculoskeletal:        General: No swelling.     Cervical back: Neck supple.  Skin:    General: Skin is warm and dry.     Capillary Refill: Capillary refill takes less than 2 seconds.  Neurological:     General: No focal  deficit present.     Mental Status: He is alert and oriented to person, place, and time. Mental status is at baseline.     GCS: GCS eye subscore is 4. GCS verbal subscore is 5. GCS motor subscore is 6.     Cranial Nerves: Cranial nerves 2-12 are intact.     Sensory: Sensation is intact.     Motor: Motor function is intact.  Psychiatric:        Mood and Affect: Mood normal.     (all labs ordered are listed, but only abnormal results are displayed) Labs Reviewed - No data to display  EKG: None  Radiology: No results found.   Procedures   Medications Ordered in the ED - No data to display                                  Medical Decision Making  Physical assessment this patient does not show any acute retinal detachment or any other concerning etiology at this time.  He does not have any neurologic deficits nor does he  have any have any concerning symptoms such as headache, dizziness, vertigo.  Given these reassuring findings, discussed with patient that this will require further ophthalmologic evaluation that will need to take place in the outpatient setting.  He understands and is in agreement with this care plan has no further concerns at this time, thus we will discharge patient with outpatient follow-up as discussed and will provide ambulatory referral to ophthalmology.     Final diagnoses:  Decreased peripheral vision of right eye    ED Discharge Orders          Ordered    Ambulatory referral to Ophthalmology        02/10/24 1614               Myriam Dorn BROCKS, [place] 02/10/24 1618    Doretha Folks, MD 02/10/24 2344  "

## 2024-02-10 NOTE — Discharge Instructions (Signed)
 Please follow-up with your ophthalmologist for continued assessment of your chronic vision changes.

## 2024-02-10 NOTE — ED Triage Notes (Signed)
 PT started to see a curtain on side of right eye 1 month ago. Pt went to doctor and was told that it was due to surgery that was done to correct astigmatism May 2020. Pt was told if the curtain got bigger to be reevaluated and pt states that the vision on right side appears like there is a shadow and will occasionally get floaters in eye that will disappear. Vision is usual worse in morning when waking up and then will improve. Denies pain but feels like eye is dry.
